# Patient Record
Sex: Female | Born: 1975 | Race: Black or African American | Hispanic: No | Marital: Single | Smoking: Never smoker
Health system: Southern US, Community
[De-identification: ages and names within clinical notes are randomized; demographics above are authoritative.]

## PROBLEM LIST (undated history)

## (undated) ENCOUNTER — Inpatient Hospital Stay (HOSPITAL_COMMUNITY): Admission: AD | Payer: 59 | Admitting: Obstetrics and Gynecology

## (undated) ENCOUNTER — Inpatient Hospital Stay (HOSPITAL_COMMUNITY): Payer: Self-pay

## (undated) DIAGNOSIS — D649 Anemia, unspecified: Secondary | ICD-10-CM

## (undated) DIAGNOSIS — Z789 Other specified health status: Secondary | ICD-10-CM

## (undated) DIAGNOSIS — B999 Unspecified infectious disease: Secondary | ICD-10-CM

## (undated) DIAGNOSIS — IMO0002 Reserved for concepts with insufficient information to code with codable children: Secondary | ICD-10-CM

## (undated) DIAGNOSIS — O1490 Unspecified pre-eclampsia, unspecified trimester: Secondary | ICD-10-CM

## (undated) DIAGNOSIS — R011 Cardiac murmur, unspecified: Secondary | ICD-10-CM

## (undated) DIAGNOSIS — N189 Chronic kidney disease, unspecified: Secondary | ICD-10-CM

## (undated) HISTORY — PX: WISDOM TOOTH EXTRACTION: SHX21

## (undated) HISTORY — DX: Unspecified infectious disease: B99.9

## (undated) HISTORY — PX: THERAPEUTIC ABORTION: SHX798

## (undated) HISTORY — PX: DILATION AND CURETTAGE OF UTERUS: SHX78

---

## 2003-05-01 ENCOUNTER — Inpatient Hospital Stay (HOSPITAL_COMMUNITY): Admission: AD | Admit: 2003-05-01 | Discharge: 2003-05-01 | Payer: Self-pay | Admitting: Obstetrics & Gynecology

## 2003-05-01 ENCOUNTER — Encounter: Payer: Self-pay | Admitting: Obstetrics

## 2003-10-10 ENCOUNTER — Emergency Department (HOSPITAL_COMMUNITY): Admission: EM | Admit: 2003-10-10 | Discharge: 2003-10-10 | Payer: Self-pay | Admitting: Emergency Medicine

## 2004-06-30 ENCOUNTER — Emergency Department (HOSPITAL_COMMUNITY): Admission: EM | Admit: 2004-06-30 | Discharge: 2004-06-30 | Payer: Self-pay | Admitting: Internal Medicine

## 2004-11-19 ENCOUNTER — Emergency Department (HOSPITAL_COMMUNITY): Admission: EM | Admit: 2004-11-19 | Discharge: 2004-11-19 | Payer: Self-pay | Admitting: Family Medicine

## 2005-04-14 ENCOUNTER — Emergency Department (HOSPITAL_COMMUNITY): Admission: EM | Admit: 2005-04-14 | Discharge: 2005-04-14 | Payer: Self-pay | Admitting: Emergency Medicine

## 2009-03-13 ENCOUNTER — Emergency Department (HOSPITAL_COMMUNITY): Admission: EM | Admit: 2009-03-13 | Discharge: 2009-03-14 | Payer: Self-pay | Admitting: Emergency Medicine

## 2009-07-18 ENCOUNTER — Emergency Department (HOSPITAL_COMMUNITY): Admission: EM | Admit: 2009-07-18 | Discharge: 2009-07-18 | Payer: Self-pay | Admitting: Emergency Medicine

## 2009-08-17 ENCOUNTER — Ambulatory Visit: Payer: Self-pay | Admitting: Internal Medicine

## 2009-08-17 DIAGNOSIS — F3289 Other specified depressive episodes: Secondary | ICD-10-CM | POA: Insufficient documentation

## 2009-08-17 DIAGNOSIS — F329 Major depressive disorder, single episode, unspecified: Secondary | ICD-10-CM | POA: Insufficient documentation

## 2009-08-17 LAB — CONVERTED CEMR LAB
ALT: 17 units/L (ref 0–35)
CO2: 30 meq/L (ref 19–32)
Eosinophils Absolute: 0.1 10*3/uL (ref 0.0–0.7)
Eosinophils Relative: 1.6 % (ref 0.0–5.0)
Lymphocytes Relative: 29.6 % (ref 12.0–46.0)
Lymphs Abs: 1.6 10*3/uL (ref 0.7–4.0)
MCHC: 33 g/dL (ref 30.0–36.0)
Neutrophils Relative %: 57.8 % (ref 43.0–77.0)
Platelets: 312 10*3/uL (ref 150.0–400.0)
RBC: 4.3 M/uL (ref 3.87–5.11)
RDW: 12.7 % (ref 11.5–14.6)
Total Protein: 8.1 g/dL (ref 6.0–8.3)
WBC: 5.4 10*3/uL (ref 4.5–10.5)

## 2010-03-24 ENCOUNTER — Emergency Department (HOSPITAL_COMMUNITY): Admission: EM | Admit: 2010-03-24 | Discharge: 2010-03-24 | Payer: Self-pay | Admitting: Emergency Medicine

## 2010-07-28 ENCOUNTER — Emergency Department (HOSPITAL_COMMUNITY): Admission: EM | Admit: 2010-07-28 | Discharge: 2010-07-28 | Payer: Self-pay | Admitting: Emergency Medicine

## 2010-12-01 LAB — URINALYSIS, ROUTINE W REFLEX MICROSCOPIC
Glucose, UA: NEGATIVE mg/dL
Hgb urine dipstick: NEGATIVE
Specific Gravity, Urine: 1.028 (ref 1.005–1.030)
pH: 6.5 (ref 5.0–8.0)

## 2010-12-01 LAB — POCT PREGNANCY, URINE: Preg Test, Ur: NEGATIVE

## 2010-12-01 LAB — URINE MICROSCOPIC-ADD ON

## 2010-12-18 LAB — URINALYSIS, ROUTINE W REFLEX MICROSCOPIC
Leukocytes, UA: NEGATIVE
Nitrite: NEGATIVE
Specific Gravity, Urine: 1.014 (ref 1.005–1.030)
Urobilinogen, UA: 1 mg/dL (ref 0.0–1.0)

## 2010-12-18 LAB — RAPID URINE DRUG SCREEN, HOSP PERFORMED
Opiates: NOT DETECTED
Tetrahydrocannabinol: NOT DETECTED

## 2010-12-18 LAB — CBC
Hemoglobin: 12.3 g/dL (ref 12.0–15.0)
RBC: 4.14 MIL/uL (ref 3.87–5.11)
WBC: 6.3 10*3/uL (ref 4.0–10.5)

## 2010-12-18 LAB — COMPREHENSIVE METABOLIC PANEL
ALT: 18 U/L (ref 0–35)
AST: 22 U/L (ref 0–37)
Alkaline Phosphatase: 59 U/L (ref 39–117)
CO2: 28 mEq/L (ref 19–32)
Chloride: 104 mEq/L (ref 96–112)
GFR calc Af Amer: >60 mL/min (ref >60)
GFR calc non Af Amer: >60 mL/min (ref >60)
Glucose, Bld: 97 mg/dL (ref 70–99)
Potassium: 3.9 mEq/L (ref 3.5–5.1)
Sodium: 135 mEq/L (ref 135–145)

## 2010-12-18 LAB — URINE MICROSCOPIC-ADD ON

## 2010-12-18 LAB — ETHANOL: Alcohol, Ethyl (B): <5 mg/dL (ref 0–10)

## 2010-12-18 LAB — DIFFERENTIAL
Basophils Relative: 2 % — ABNORMAL HIGH (ref 0–1)
Eosinophils Absolute: 0.1 10*3/uL (ref 0.0–0.7)
Eosinophils Relative: 1 % (ref 0–5)
Neutrophils Relative %: 59 % (ref 43–77)

## 2010-12-23 LAB — URINALYSIS, ROUTINE W REFLEX MICROSCOPIC
Glucose, UA: NEGATIVE mg/dL
Ketones, ur: NEGATIVE mg/dL
Protein, ur: NEGATIVE mg/dL
Urobilinogen, UA: 1 mg/dL (ref 0.0–1.0)

## 2010-12-23 LAB — DIFFERENTIAL
Eosinophils Relative: 0 % (ref 0–5)
Lymphocytes Relative: 14 % (ref 12–46)
Lymphs Abs: 1.8 10*3/uL (ref 0.7–4.0)
Monocytes Absolute: 1.6 10*3/uL — ABNORMAL HIGH (ref 0.1–1.0)

## 2010-12-23 LAB — URINE CULTURE: Colony Count: >=100000

## 2010-12-23 LAB — BASIC METABOLIC PANEL
CO2: 27 mEq/L (ref 19–32)
Chloride: 105 mEq/L (ref 96–112)
GFR calc Af Amer: >60 mL/min (ref >60)
GFR calc non Af Amer: >60 mL/min (ref >60)
Glucose, Bld: 112 mg/dL — ABNORMAL HIGH (ref 70–99)

## 2010-12-23 LAB — URINE MICROSCOPIC-ADD ON

## 2010-12-23 LAB — CBC
HCT: 35.5 % — ABNORMAL LOW (ref 36.0–46.0)
Hemoglobin: 11.7 g/dL — ABNORMAL LOW (ref 12.0–15.0)
RBC: 4.05 MIL/uL (ref 3.87–5.11)
WBC: 12.7 10*3/uL — ABNORMAL HIGH (ref 4.0–10.5)

## 2010-12-26 ENCOUNTER — Ambulatory Visit (HOSPITAL_COMMUNITY)
Admission: RE | Admit: 2010-12-26 | Discharge: 2010-12-26 | Disposition: A | Discharge status: Home / Self Care | Payer: PRIVATE HEALTH INSURANCE | Attending: Psychiatry | Admitting: Psychiatry

## 2010-12-26 DIAGNOSIS — F321 Major depressive disorder, single episode, moderate: Secondary | ICD-10-CM | POA: Insufficient documentation

## 2011-01-01 ENCOUNTER — Other Ambulatory Visit (HOSPITAL_COMMUNITY): Payer: PRIVATE HEALTH INSURANCE | Admitting: Psychiatry

## 2011-01-02 ENCOUNTER — Other Ambulatory Visit (HOSPITAL_COMMUNITY): Payer: PRIVATE HEALTH INSURANCE | Attending: Psychiatry | Admitting: Psychiatry

## 2011-01-02 DIAGNOSIS — F321 Major depressive disorder, single episode, moderate: Secondary | ICD-10-CM | POA: Insufficient documentation

## 2011-01-02 DIAGNOSIS — F329 Major depressive disorder, single episode, unspecified: Secondary | ICD-10-CM

## 2011-01-03 ENCOUNTER — Other Ambulatory Visit (HOSPITAL_COMMUNITY): Payer: PRIVATE HEALTH INSURANCE | Admitting: Psychiatry

## 2011-01-06 ENCOUNTER — Other Ambulatory Visit (HOSPITAL_COMMUNITY): Payer: PRIVATE HEALTH INSURANCE | Admitting: Psychiatry

## 2011-01-07 ENCOUNTER — Other Ambulatory Visit (HOSPITAL_COMMUNITY): Payer: PRIVATE HEALTH INSURANCE | Admitting: Psychiatry

## 2011-01-08 ENCOUNTER — Other Ambulatory Visit (HOSPITAL_COMMUNITY): Payer: PRIVATE HEALTH INSURANCE | Admitting: Psychiatry

## 2011-01-09 ENCOUNTER — Other Ambulatory Visit (HOSPITAL_COMMUNITY): Payer: PRIVATE HEALTH INSURANCE | Admitting: Psychiatry

## 2011-01-10 ENCOUNTER — Other Ambulatory Visit (HOSPITAL_COMMUNITY): Payer: PRIVATE HEALTH INSURANCE | Admitting: Psychiatry

## 2011-01-13 ENCOUNTER — Other Ambulatory Visit (HOSPITAL_COMMUNITY): Payer: PRIVATE HEALTH INSURANCE | Admitting: Psychiatry

## 2011-01-14 ENCOUNTER — Other Ambulatory Visit (HOSPITAL_COMMUNITY): Payer: PRIVATE HEALTH INSURANCE | Attending: Psychiatry | Admitting: Psychiatry

## 2011-01-14 DIAGNOSIS — E669 Obesity, unspecified: Secondary | ICD-10-CM | POA: Insufficient documentation

## 2011-01-14 DIAGNOSIS — Z6379 Other stressful life events affecting family and household: Secondary | ICD-10-CM | POA: Insufficient documentation

## 2011-01-14 DIAGNOSIS — F329 Major depressive disorder, single episode, unspecified: Secondary | ICD-10-CM | POA: Insufficient documentation

## 2011-01-14 DIAGNOSIS — F3289 Other specified depressive episodes: Secondary | ICD-10-CM | POA: Insufficient documentation

## 2011-01-15 ENCOUNTER — Other Ambulatory Visit (HOSPITAL_COMMUNITY): Payer: PRIVATE HEALTH INSURANCE | Admitting: Psychiatry

## 2011-01-16 ENCOUNTER — Other Ambulatory Visit (HOSPITAL_COMMUNITY): Payer: PRIVATE HEALTH INSURANCE | Admitting: Psychiatry

## 2011-01-17 ENCOUNTER — Other Ambulatory Visit (HOSPITAL_COMMUNITY): Payer: PRIVATE HEALTH INSURANCE | Admitting: Psychiatry

## 2011-01-17 ENCOUNTER — Ambulatory Visit (HOSPITAL_BASED_OUTPATIENT_CLINIC_OR_DEPARTMENT_OTHER): Payer: 59 | Admitting: Psychology

## 2011-01-17 DIAGNOSIS — F331 Major depressive disorder, recurrent, moderate: Secondary | ICD-10-CM

## 2011-01-20 ENCOUNTER — Other Ambulatory Visit (HOSPITAL_COMMUNITY): Payer: PRIVATE HEALTH INSURANCE | Admitting: Psychiatry

## 2011-01-21 ENCOUNTER — Other Ambulatory Visit (HOSPITAL_COMMUNITY): Payer: PRIVATE HEALTH INSURANCE | Admitting: Psychiatry

## 2011-01-24 ENCOUNTER — Encounter (HOSPITAL_COMMUNITY): Payer: 59 | Admitting: Psychology

## 2011-02-12 ENCOUNTER — Ambulatory Visit (HOSPITAL_COMMUNITY): Payer: 59 | Admitting: Psychiatry

## 2011-07-29 ENCOUNTER — Encounter: Payer: Self-pay | Admitting: Emergency Medicine

## 2011-07-29 ENCOUNTER — Emergency Department (HOSPITAL_COMMUNITY)
Admission: EM | Admit: 2011-07-29 | Discharge: 2011-07-30 | Discharge status: Against Medical Advice | Payer: 59 | Attending: Emergency Medicine | Admitting: Emergency Medicine

## 2011-07-29 DIAGNOSIS — R109 Unspecified abdominal pain: Secondary | ICD-10-CM | POA: Insufficient documentation

## 2011-07-29 NOTE — ED Notes (Signed)
Pt states she feels like her body is telling her something is not right  Pt states she has a headache, is very tired, is having pain and cramping all over in her stomach  Pt states she gets abd pain off and on and last time she saw a dr she was told the pain may be caused by ovulation but this pain is different  Pt states she had to leave work early today because of the pain   Pt state she has spells of nausea without vomiting  Pt states she does not have diarrhea but is having more frequent bowel movements than she normally does

## 2011-08-01 ENCOUNTER — Emergency Department (INDEPENDENT_AMBULATORY_CARE_PROVIDER_SITE_OTHER)
Admission: EM | Admit: 2011-08-01 | Discharge: 2011-08-01 | Disposition: A | Discharge status: Nursing Home (Includes ICF) | Payer: 59 | Source: Home / Self Care | Attending: Emergency Medicine | Admitting: Emergency Medicine

## 2011-08-01 ENCOUNTER — Emergency Department (HOSPITAL_COMMUNITY)
Admission: EM | Admit: 2011-08-01 | Discharge: 2011-08-02 | Disposition: A | Discharge status: Home / Self Care | Payer: 59 | Attending: Emergency Medicine | Admitting: Emergency Medicine

## 2011-08-01 ENCOUNTER — Encounter (HOSPITAL_COMMUNITY): Payer: Self-pay

## 2011-08-01 ENCOUNTER — Encounter (HOSPITAL_COMMUNITY): Payer: Self-pay | Admitting: Emergency Medicine

## 2011-08-01 DIAGNOSIS — B9689 Other specified bacterial agents as the cause of diseases classified elsewhere: Secondary | ICD-10-CM

## 2011-08-01 DIAGNOSIS — N760 Acute vaginitis: Secondary | ICD-10-CM | POA: Insufficient documentation

## 2011-08-01 DIAGNOSIS — N83209 Unspecified ovarian cyst, unspecified side: Secondary | ICD-10-CM | POA: Insufficient documentation

## 2011-08-01 DIAGNOSIS — R1031 Right lower quadrant pain: Secondary | ICD-10-CM | POA: Insufficient documentation

## 2011-08-01 DIAGNOSIS — A499 Bacterial infection, unspecified: Secondary | ICD-10-CM | POA: Insufficient documentation

## 2011-08-01 DIAGNOSIS — N83201 Unspecified ovarian cyst, right side: Secondary | ICD-10-CM

## 2011-08-01 DIAGNOSIS — R1032 Left lower quadrant pain: Secondary | ICD-10-CM | POA: Insufficient documentation

## 2011-08-01 DIAGNOSIS — R51 Headache: Secondary | ICD-10-CM

## 2011-08-01 DIAGNOSIS — R109 Unspecified abdominal pain: Secondary | ICD-10-CM

## 2011-08-01 NOTE — ED Notes (Signed)
Pt here today due to pain she states has been going on for the past 3 weeks, and getting worse. Last saw her OB-GYN  (Dr Thomasene Lot ) in September for regular check up. Pain today LLQ, RLQ, LUQ ; denies vomiting, or diarrhea, but has been having nausea, HA, dizziness; has not taken anything for this problem

## 2011-08-01 NOTE — ED Provider Notes (Signed)
History     CSN: [id number] Arrival date & time: 08/01/2011  9:28 PM   First MD Initiated Contact with Patient 08/01/11 2023      Chief Complaint  Patient presents with  . Abdominal Pain   HPI Comments: Pt with 3 weeks of intermittent RLQ and LLQ pain, not associated with eating, fasting, BM, walking. Pain worse with lying on her left side. Has nausea, decreased appetite. no vomiting, fevers, abd distension, diarrhea, constipation. Last BM earlier today was WNL. No vaginal bleeding, vaginal d/c. Presented to ED for same pain earlier this week but left prior to triage. Has not tried anything for this. No h/o abd surgeries. Also c/o generalized weakness, constant HA and lighthededness today.   Patient is a 35 y.o. female presenting with abdominal pain. The history is provided by the patient.  Abdominal Pain The primary symptoms of the illness include abdominal pain, fever, fatigue and nausea. The primary symptoms of the illness do not include shortness of breath, vomiting, vaginal discharge or vaginal bleeding. The current episode started more than 2 days ago. The problem has been gradually worsening.  The patient states that she believes she is currently not pregnant. The patient has not had a change in bowel habit. Additional symptoms associated with the illness include anorexia. Symptoms associated with the illness do not include chills, diaphoresis, constipation, urgency, hematuria, frequency or back pain.    History reviewed. No pertinent past medical history.  History reviewed. No pertinent past surgical history.  Family History  Problem Relation Age of Onset  . Diabetes Other     History  Substance Use Topics  . Smoking status: Never Smoker   . Smokeless tobacco: Not on file  . Alcohol Use: No    OB History    Grav Para Term Preterm Abortions TAB SAB Ect Mult Living                  Review of Systems  Constitutional: Positive for fever and fatigue. Negative for chills  and diaphoresis.  Respiratory: Negative for chest tightness, shortness of breath and wheezing.   Cardiovascular: Negative for chest pain.  Gastrointestinal: Positive for nausea, abdominal pain and anorexia. Negative for vomiting, constipation and blood in stool.  Genitourinary: Positive for pelvic pain. Negative for urgency, frequency, hematuria, vaginal bleeding, vaginal discharge and vaginal pain.  Musculoskeletal: Negative for back pain.  Skin: Negative for rash.  Neurological: Positive for light-headedness and headaches.    Allergies  Review of patient's allergies indicates no known allergies.  Home Medications  No current outpatient prescriptions on file.  BP 127/82  Pulse 67  Temp(Src) 98.4 F (36.9 C) (Oral)  Resp 20  SpO2 100%  LMP 07/22/2011  Physical Exam  Nursing note and vitals reviewed. Constitutional: She is oriented to person, place, and time. She appears well-developed and well-nourished. No distress.  HENT:  Head: Normocephalic and atraumatic.  Eyes: EOM are normal. Pupils are equal, round, and reactive to light.  Neck: Normal range of motion.  Cardiovascular: Regular rhythm.   Pulmonary/Chest: Effort normal and breath sounds normal.  Abdominal: Soft. Normal appearance and bowel sounds are normal. She exhibits no distension and no mass. There is tenderness in the right lower quadrant and left lower quadrant. There is no rebound, no guarding, no CVA tenderness, no tenderness at McBurney's point and negative Murphy's sign.  Musculoskeletal: Normal range of motion.  Neurological: She is alert and oriented to person, place, and time. She has normal strength. No cranial  nerve deficit. Gait normal.  Skin: Skin is warm and dry.  Psychiatric: She has a normal mood and affect. Her behavior is normal. Judgment and thought content normal.    ED Course  Procedures (including critical care time)  Labs Reviewed - No data to display No results found.   1. Abdominal  pain   2. Headache      MDM    Luiz Blare, MD 08/01/11 [phone]

## 2011-08-01 NOTE — ED Notes (Signed)
Pt from [facility].  C/o intermittent lower abd pain x 3 weeks that is gradually getting worse.  C/o nausea.  Denies vomiting and urinary complaints.  Normal BM today.  Also reports headache and feeling lightheaded.

## 2011-08-01 NOTE — ED Notes (Signed)
Pt states she went to the ED earlier this week, but LWBS

## 2011-08-02 ENCOUNTER — Emergency Department (HOSPITAL_COMMUNITY): Payer: 59

## 2011-08-02 ENCOUNTER — Encounter (HOSPITAL_COMMUNITY): Payer: Self-pay

## 2011-08-02 LAB — PREGNANCY, URINE: Preg Test, Ur: NEGATIVE

## 2011-08-02 LAB — WET PREP, GENITAL
Trich, Wet Prep: NONE SEEN
WBC, Wet Prep HPF POC: NONE SEEN
Yeast Wet Prep HPF POC: NONE SEEN

## 2011-08-02 LAB — URINALYSIS, ROUTINE W REFLEX MICROSCOPIC
Bilirubin Urine: NEGATIVE
Glucose, UA: NEGATIVE mg/dL
Hgb urine dipstick: NEGATIVE
Ketones, ur: NEGATIVE mg/dL
Leukocytes, UA: NEGATIVE
Nitrite: NEGATIVE
Protein, ur: NEGATIVE mg/dL
Specific Gravity, Urine: 1.023 (ref 1.005–1.030)
Urobilinogen, UA: 1 mg/dL (ref 0.0–1.0)
pH: 6 (ref 5.0–8.0)

## 2011-08-02 MED ORDER — METRONIDAZOLE 500 MG PO TABS: 500.0000 mg | ORAL_TABLET | Freq: Two times a day (BID) | ORAL | Status: AC

## 2011-08-02 MED ORDER — ACETAMINOPHEN-CODEINE #3 300-30 MG PO TABS: 1.0000 | ORAL_TABLET | Freq: Four times a day (QID) | ORAL | Status: AC | PRN

## 2011-08-02 NOTE — Discharge Instructions (Signed)
Bacterial Vaginosis Bacterial vaginosis (BV) is a vaginal infection where the normal balance of bacteria in the vagina is disrupted. The normal balance is then replaced by an overgrowth of certain bacteria. There are several different kinds of bacteria that can cause BV. BV is the most common vaginal infection in women of childbearing age. CAUSES   The cause of BV is not fully understood. BV develops when there is an increase or imbalance of harmful bacteria.   Some activities or behaviors can upset the normal balance of bacteria in the vagina and put women at increased risk including:   Having a new sex partner or multiple sex partners.   Douching.   Using an intrauterine device (IUD) for contraception.   It is not clear what role sexual activity plays in the development of BV. However, women that have never had sexual intercourse are rarely infected with BV.  Women do not get BV from toilet seats, bedding, swimming pools or from touching objects around them.  SYMPTOMS   Grey vaginal discharge.   A fish-like odor with discharge, especially after sexual intercourse.   Itching or burning of the vagina and vulva.   Burning or pain with urination.   Some women have no signs or symptoms at all.  DIAGNOSIS  Your caregiver must examine the vagina for signs of BV. Your caregiver will perform lab tests and look at the sample of vaginal fluid through a microscope. They will look for bacteria and abnormal cells (clue cells), a pH test higher than 4.5, and a positive amine test all associated with BV.  RISKS AND COMPLICATIONS   Pelvic inflammatory disease (PID).   Infections following gynecology surgery.   Developing HIV.   Developing herpes virus.  TREATMENT  Sometimes BV will clear up without treatment. However, all women with symptoms of BV should be treated to avoid complications, especially if gynecology surgery is planned. Female partners generally do not need to be treated. However,  BV may spread between female sex partners so treatment is helpful in preventing a recurrence of BV.   BV may be treated with antibiotics. The antibiotics come in either pill or vaginal cream forms. Either can be used with nonpregnant or pregnant women, but the recommended dosages differ. These antibiotics are not harmful to the baby.   BV can recur after treatment. If this happens, a second round of antibiotics will often be prescribed.   Treatment is important for pregnant women. If not treated, BV can cause a premature delivery, especially for a pregnant woman who had a premature birth in the past. All pregnant women who have symptoms of BV should be checked and treated.   For chronic reoccurrence of BV, treatment with a type of prescribed gel vaginally twice a week is helpful.  HOME CARE INSTRUCTIONS   Finish all medication as directed by your caregiver.   Do not have sex until treatment is completed.   Tell your sexual partner that you have a vaginal infection. They should see their caregiver and be treated if they have problems, such as a mild rash or itching.   Practice safe sex. Use condoms. Only have 1 sex partner.  PREVENTION  Basic prevention steps can help reduce the risk of upsetting the natural balance of bacteria in the vagina and developing BV:  Do not have sexual intercourse (be abstinent).   Do not douche.   Use all of the medicine prescribed for treatment of BV, even if the signs and symptoms go away.     Tell your sex partner if you have BV. That way, they can be treated, if needed, to prevent reoccurrence.  SEEK MEDICAL CARE IF:   Your symptoms are not improving after 3 days of treatment.   You have increased discharge, pain, or fever.  MAKE SURE YOU:   Understand these instructions.   Will watch your condition.   Will get help right away if you are not doing well or get worse.  FOR MORE INFORMATION  Division of STD Prevention (DSTDP), Centers for Disease  Control and Prevention: www.cdc.gov/std American [employer] Association (ASHA): www.ashastd.org  Document Released: 09/01/2005 Document Revised: 05/14/2011 Document Reviewed: 02/22/2009 ExitCare Patient Information 2012 [employer].Ovarian Cyst The ovaries are small organs that are on each side of the uterus. The ovaries are the organs that produce the female hormones, estrogen and progesterone. An ovarian cyst is a sac filled with fluid that can vary in its size. It is normal for a small cyst to form in women who are in the childbearing age and who have menstrual periods. This type of cyst is called a follicle cyst that becomes an ovulation cyst (corpus luteum cyst) after it produces the women's egg. It later goes away on its own if the woman does not become pregnant. There are other kinds of ovarian cysts that may cause problems and may need to be treated. The most serious problem is a cyst with cancer. It should be noted that menopausal women who have an ovarian cyst are at a higher risk of it being a cancer cyst. They should be evaluated very quickly, thoroughly and followed closely. This is especially true in menopausal women because of the high rate of ovarian cancer in women in menopause. CAUSES AND TYPES OF OVARIAN CYSTS:  FUNCTIONAL CYST: The follicle/corpus luteum cyst is a functional cyst that occurs every month during ovulation with the menstrual cycle. They go away with the next menstrual cycle if the woman does not get pregnant. Usually, there are no symptoms with a functional cyst.   ENDOMETRIOMA CYST: This cyst develops from the lining of the uterus tissue. This cyst gets in or on the ovary. It grows every month from the bleeding during the menstrual period. It is also called a "chocolate cyst" because it becomes filled with blood that turns Tuccillo. This cyst can cause pain in the lower abdomen during intercourse and with your menstrual period.   CYSTADENOMA CYST: This cyst develops  from the cells on the outside of the ovary. They usually are not cancerous. They can get very big and cause lower abdomen pain and pain with intercourse. This type of cyst can twist on itself, cut off its blood supply and cause severe pain. It also can easily rupture and cause a lot of pain.   DERMOID CYST: This type of cyst is sometimes found in both ovaries. They are found to have different kinds of body tissue in the cyst. The tissue includes skin, teeth, hair, and/or cartilage. They usually do not have symptoms unless they get very big. Dermoid cysts are rarely cancerous.   POLYCYSTIC OVARY: This is a rare condition with hormone problems that produces many small cysts on both ovaries. The cysts are follicle-like cysts that never produce an egg and become a corpus luteum. It can cause an increase in body weight, infertility, acne, increase in body and facial hair and lack of menstrual periods or rare menstrual periods. Many women with this problem develop type 2 diabetes. The exact cause of this   problem is unknown. A polycystic ovary is rarely cancerous.   THECA LUTEIN CYST: Occurs when too much hormone (human chorionic gonadotropin) is produced and over-stimulates the ovaries to produce an egg. They are frequently seen when doctors stimulate the ovaries for invitro-fertilization (test tube babies).   LUTEOMA CYST: This cyst is seen during pregnancy. Rarely it can cause an obstruction to the birth canal during labor and delivery. They usually go away after delivery.  SYMPTOMS   Pelvic pain or pressure.   Pain during sexual intercourse.   Increasing girth (swelling) of the abdomen.   Abnormal menstrual periods.   Increasing pain with menstrual periods.   You stop having menstrual periods and you are not pregnant.  DIAGNOSIS  The diagnosis can be made during:  Routine or annual pelvic examination (common).   Ultrasound.   X-Ahniya Mitchum of the pelvis.   CT Scan.   MRI.   Blood tests.    TREATMENT   Treatment may only be to follow the cyst monthly for 2 to 3 months with your caregiver. Many go away on their own, especially functional cysts.   May be aspirated (drained) with a long needle with ultrasound, or by laparoscopy (inserting a tube into the pelvis through a small incision).   The whole cyst can be removed by laparoscopy.   Sometimes the cyst may need to be removed through an incision in the lower abdomen.   Hormone treatment is sometimes used to help dissolve certain cysts.   Birth control pills are sometimes used to help dissolve certain cysts.  HOME CARE INSTRUCTIONS  Follow your caregiver's advice regarding:  Medicine.   Follow up visits to evaluate and treat the cyst.   You may need to come back or make an appointment with another caregiver, to find the exact cause of your cyst, if your caregiver is not a gynecologist.   Get your yearly and recommended pelvic examinations and Pap tests.   Let your caregiver know if you have had an ovarian cyst in the past.  SEEK MEDICAL CARE IF:   Your periods are late, irregular, they stop, or are painful.   Your stomach (abdomen) or pelvic pain does not go away.   Your stomach becomes larger or swollen.   You have pressure on your bladder or trouble emptying your bladder completely.   You have painful sexual intercourse.   You have feelings of fullness, pressure, or discomfort in your stomach.   You lose weight for no apparent reason.   You feel generally ill.   You become constipated.   You lose your appetite.   You develop acne.   You have an increase in body and facial hair.   You are gaining weight, without changing your exercise and eating habits.   You think you are pregnant.  SEEK IMMEDIATE MEDICAL CARE IF:   You have increasing abdominal pain.   You feel sick to your stomach (nausea) and/or vomit.   You develop a fever that comes on suddenly.   You develop abdominal pain during a  bowel movement.   Your menstrual periods become heavier than usual.  Document Released: 09/01/2005 Document Revised: 05/14/2011 Document Reviewed: 07/05/2009 ExitCare Patient Information 2012 [employer]. 

## 2011-08-02 NOTE — ED Provider Notes (Signed)
History     CSN: [id number] Arrival date & time: 08/01/2011 11:45 PM   First MD Initiated Contact with Patient 08/02/11 0004      Chief Complaint  Patient presents with  . 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 She reports that this was told to her by her OB/GYN. However normally she only has pain for about one day and over the last 3 days she's had more constant pain on the right lower abdomen close to her flank area. She's also had some moving pains in the left lower quadrant as well. She denies any fever or chills. She is mainly concerned because his symptoms have been getting worse and have persisted. She has taken some over-the-counter medications without any long-lasting relief. She has not spoken to her OB/GYN. She did have an appointment this past Friday but hadn't missed to do to a work meeting. She also tried to get a was a long on Monday but due to the long wait went home without being seen. She was seen at the urgent care this evening but after initial evaluation was sent here to the emergency department for further evaluation. No testing was done at that time. The patient reports she has mild to moderate pain currently but when asked states she does not want any pain medications at this time. She has been sexually active with a relatively new partner over the last 3 or 4 months. She has been pregnant in the past and reports that she and her partner have been trying to get pregnant. Her last period was about 10 days ago. She has noticed a slight white discharge without any foul odor. She denies fever, chills, rash, diarrhea, nausea or vomiting.  Patient is a 35 y.o. female presenting with abdominal pain. The history is provided by the patient.  Abdominal Pain The primary symptoms  of the illness include abdominal pain. The primary symptoms of the illness do not include nausea, vomiting or diarrhea.  Symptoms associated with the illness do not include urgency.    History reviewed. No pertinent past medical history.  History reviewed. No pertinent past surgical history.  Family History  Problem Relation Age of Onset  . Diabetes Other     History  Substance Use Topics  . Smoking status: Never Smoker   . Smokeless tobacco: Not on file  . Alcohol Use: No    OB History    Grav Para Term Preterm Abortions TAB SAB Ect Mult Living                  Review of Systems  Constitutional: Negative.   Gastrointestinal: Positive for abdominal pain. Negative for nausea, vomiting and diarrhea.  Genitourinary: Negative for urgency, flank pain and pelvic pain.  All other systems reviewed and are negative.    Allergies  Review of patient's allergies indicates no known allergies.  Home Medications  No current outpatient prescriptions on file.  BP 124/63  Pulse 70  Temp(Src) 98.2 F (36.8 C) (Oral)  SpO2 99%  LMP 07/22/2011  Physical Exam  Constitutional: She appears well-developed and well-nourished.  Eyes: Pupils are equal, round, and reactive to light. No scleral icterus.  Neck: Neck supple.  Abdominal: Soft. Bowel sounds are normal. She exhibits no distension.    Genitourinary:       Patient with no cervical motion tenderness. There is a  white creamy discharge that is mild. She has minimal to mild tenderness in the left adnexal region greater than right. No bleeding is noted the  Skin: No rash noted.    ED Course  Procedures (including critical care time)   Labs Reviewed  URINALYSIS, ROUTINE W REFLEX MICROSCOPIC  PREGNANCY, URINE  GC/CHLAMYDIA PROBE AMP, GENITAL  WET PREP, GENITAL   No results found.   No diagnosis found.   RA sats is 99% and normal MDM   Patient's exam is not too impressive to me. She does not have any pain or  tenderness at McBurney's point and with intermittent symptoms ongoing for several weeks am not suspicious for appendicitis. She is no abdominal tenderness above her umbilicus she does not have a surgical abdomen. Wet prep and GC Chlamydia tests were sent. Urine and urine pregnancy test were sent as well. She denies wanting any pain medications at this time. Ultrasound of her pelvis is currently pending. She also has a OB/GYN that she can followup with as an outpatient which is encouraged to her.      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  Otherwise, no abscess, torsion.  Wet prep shows BV.  Pt informed tests for STD's pending. Pt given analgesic prescription, flagyl and instructed to follow up with her OB/GYN for follow up.    Gavin Pound. Oletta Lamas, MD 08/02/11 2213

## 2011-08-02 NOTE — ED Notes (Signed)
Pt to US.

## 2011-08-04 LAB — GC/CHLAMYDIA PROBE AMP, GENITAL
Chlamydia, DNA Probe: NEGATIVE
GC Probe Amp, Genital: NEGATIVE

## 2011-09-23 ENCOUNTER — Emergency Department (INDEPENDENT_AMBULATORY_CARE_PROVIDER_SITE_OTHER)
Admission: EM | Admit: 2011-09-23 | Discharge: 2011-09-23 | Disposition: A | Discharge status: Home / Self Care | Payer: 59 | Source: Home / Self Care | Attending: Family Medicine | Admitting: Family Medicine

## 2011-09-23 ENCOUNTER — Encounter (HOSPITAL_COMMUNITY): Payer: Self-pay | Admitting: *Deleted

## 2011-09-23 DIAGNOSIS — N390 Urinary tract infection, site not specified: Secondary | ICD-10-CM

## 2011-09-23 LAB — POCT URINALYSIS DIP (DEVICE)
Ketones, ur: 40 mg/dL — AB
Protein, ur: >=300 mg/dL — AB
Specific Gravity, Urine: >=1.03 (ref 1.005–1.030)
pH: 6 (ref 5.0–8.0)

## 2011-09-23 MED ORDER — CEPHALEXIN 500 MG PO CAPS: 500.0000 mg | ORAL_CAPSULE | Freq: Two times a day (BID) | ORAL | Status: AC

## 2011-09-23 NOTE — ED Provider Notes (Signed)
History     CSN: [id number]  Arrival date & time 09/23/11  1721   First MD Initiated Contact with Patient 09/23/11 1734      Chief Complaint  Patient presents with  . Abdominal Pain    (Consider location/radiation/quality/duration/timing/severity/associated sxs/prior treatment) HPI Comments: Janice Anderson presents for evaluation of hematuria, dysuria, urinary frequency, and urgency over the last week. She reports passing several small clots. She reports sexual activity with one partner, without protection. She denies any fever, no nausea, no vomiting. She also reports suprapubic pressure.   Patient is a 36 y.o. female presenting with frequency. The history is provided by the patient.  Urinary Frequency This is a new problem. The current episode started more than 2 days ago. The problem occurs constantly. The problem has not changed since onset.The symptoms are aggravated by nothing. The symptoms are relieved by nothing.    History reviewed. No pertinent past medical history.  History reviewed. No pertinent past surgical history.  Family History  Problem Relation Age of Onset  . Diabetes Other     History  Substance Use Topics  . Smoking status: Never Smoker   . Smokeless tobacco: Not on file  . Alcohol Use: No    OB History    Grav Para Term Preterm Abortions TAB SAB Ect Mult Living                  Review of Systems  Constitutional: Negative.   HENT: Negative.   Eyes: Negative.   Respiratory: Negative.   Cardiovascular: Negative.   Gastrointestinal: Negative.   Genitourinary: Positive for dysuria, urgency, frequency and decreased urine volume. Negative for flank pain, vaginal bleeding and vaginal discharge.  Musculoskeletal: Negative.   Skin: Negative.   Neurological: Negative.     Allergies  Review of patient's allergies indicates no known allergies.  Home Medications   Current Outpatient Rx  Name Route Sig Dispense Refill  . CEPHALEXIN 500 MG PO CAPS Oral  Take 1 capsule (500 mg total) by mouth 2 (two) times daily. 14 capsule 0    BP 118/60  Pulse 79  Temp(Src) 98 F (36.7 C) (Oral)  Resp 16  SpO2 100%  Physical Exam  Nursing note and vitals reviewed. Constitutional: She is oriented to person, place, and time. She appears well-developed and well-nourished.  HENT:  Head: Normocephalic and atraumatic.  Eyes: EOM are normal.  Neck: Normal range of motion.  Pulmonary/Chest: Effort normal.  Abdominal: Soft. Normal appearance and bowel sounds are normal. There is tenderness in the suprapubic area.  Musculoskeletal: Normal range of motion.  Neurological: She is alert and oriented to person, place, and time.  Skin: Skin is warm and dry.  Psychiatric: Her behavior is normal.    ED Course  Procedures (including critical care time)  Labs Reviewed  POCT URINALYSIS DIP (DEVICE) - Abnormal; Notable for the following:    Bilirubin Urine SMALL (*)    Ketones, ur 40 (*)    Hgb urine dipstick LARGE (*)    Protein, ur >=300 (*)    Urobilinogen, UA 2.0 (*)    Leukocytes, UA SMALL (*) Biochemical Testing Only. Please order routine urinalysis from main lab if confirmatory testing is needed.   All other components within normal limits  POCT PREGNANCY, URINE  POCT URINALYSIS DIPSTICK  POCT PREGNANCY, URINE   No results found.   1. UTI (lower urinary tract infection)       MDM  Labs reviewed; rx for cephalexin given  Richardo Priest, MD 09/23/11 2214

## 2011-09-23 NOTE — ED Notes (Signed)
Pt  Reports low  abd  Pain  With  Frequent       painfull  Urination        Symptoms  Began  3  Days  Ago       Pt  denys  Any  Other  Symptoms   Pt  Is  Awake   As  Well  As  lert  And  Oriented

## 2011-11-14 DIAGNOSIS — B999 Unspecified infectious disease: Secondary | ICD-10-CM

## 2011-11-14 HISTORY — DX: Unspecified infectious disease: B99.9

## 2012-05-25 ENCOUNTER — Inpatient Hospital Stay (HOSPITAL_COMMUNITY): Payer: 59

## 2012-05-25 ENCOUNTER — Encounter (HOSPITAL_COMMUNITY): Payer: Self-pay

## 2012-05-25 ENCOUNTER — Inpatient Hospital Stay (HOSPITAL_COMMUNITY)
Admission: AD | Admit: 2012-05-25 | Discharge: 2012-05-25 | Disposition: A | Discharge status: Home / Self Care | Payer: 59 | Source: Ambulatory Visit | Attending: Obstetrics and Gynecology | Admitting: Obstetrics and Gynecology

## 2012-05-25 DIAGNOSIS — O99891 Other specified diseases and conditions complicating pregnancy: Secondary | ICD-10-CM | POA: Insufficient documentation

## 2012-05-25 DIAGNOSIS — Z1389 Encounter for screening for other disorder: Secondary | ICD-10-CM

## 2012-05-25 DIAGNOSIS — Z3490 Encounter for supervision of normal pregnancy, unspecified, unspecified trimester: Secondary | ICD-10-CM

## 2012-05-25 DIAGNOSIS — O26899 Other specified pregnancy related conditions, unspecified trimester: Secondary | ICD-10-CM

## 2012-05-25 DIAGNOSIS — R109 Unspecified abdominal pain: Secondary | ICD-10-CM | POA: Insufficient documentation

## 2012-05-25 HISTORY — DX: Other specified health status: Z78.9

## 2012-05-25 LAB — URINALYSIS, ROUTINE W REFLEX MICROSCOPIC
Bilirubin Urine: NEGATIVE
Glucose, UA: NEGATIVE mg/dL
Specific Gravity, Urine: 1.03 (ref 1.005–1.030)
pH: 6 (ref 5.0–8.0)

## 2012-05-25 LAB — WET PREP, GENITAL
Trich, Wet Prep: NONE SEEN
Yeast Wet Prep HPF POC: NONE SEEN

## 2012-05-25 LAB — CBC WITH DIFFERENTIAL/PLATELET
Basophils Relative: 0 % (ref 0–1)
Eosinophils Absolute: 0.1 10*3/uL (ref 0.0–0.7)
Hemoglobin: 11.8 g/dL — ABNORMAL LOW (ref 12.0–15.0)
MCH: 28.4 pg (ref 26.0–34.0)
MCHC: 33.1 g/dL (ref 30.0–36.0)
Monocytes Relative: 9 % (ref 3–12)
Neutrophils Relative %: 66 % (ref 43–77)

## 2012-05-25 LAB — ABO/RH: ABO/RH(D): AB POS

## 2012-05-25 LAB — URINE MICROSCOPIC-ADD ON

## 2012-05-25 NOTE — MAU Note (Signed)
Pt reports positive home preg test, pain on rt side today. Shortness of breath with pain. Pain just last few hours. LMP  02/28/2012

## 2012-05-25 NOTE — MAU Note (Signed)
Pt came out of room and states she needed to go outside and tell her ride she was almost done. 20 minutes later patient still has not come back, patient not in waiting room.

## 2012-05-25 NOTE — MAU Provider Note (Signed)
History     CSN: [id number]  Arrival date and time: 05/25/12 1853   None     Chief Complaint  Patient presents with  . Possible Pregnancy   HPI Janice Anderson is a 36 y.o. female @ [redacted]w[redacted]d gestation who presents to MAU with abdominal pain. The pain started today and she describes the pain as cramping pressure on the right side. She rates the pain as 8/10. First OB appointment scheduled with Dr. Claiborne Billings for 06/07/12. The history was provided by the patient.  OB History    Grav Para Term Preterm Abortions TAB SAB Ect Mult Living   7 3 2 1 3 1 2   2       Past Medical History  Diagnosis Date  . No pertinent past medical history     Past Surgical History  Procedure Date  . No past surgeries     Family History  Problem Relation Age of Onset  . Diabetes Other   . Other Neg Hx     History  Substance Use Topics  . Smoking status: Never Smoker   . Smokeless tobacco: Not on file  . Alcohol Use: No    Allergies: No Known Allergies  No prescriptions prior to admission    Review of Systems  Constitutional: Negative for fever, chills and weight loss.  HENT: Negative for ear pain, nosebleeds, congestion, sore throat and neck pain.   Eyes: Negative for blurred vision, double vision, photophobia and pain.  Respiratory: Negative for cough and wheezing.   Cardiovascular: Negative for chest pain, palpitations and leg swelling.  Gastrointestinal: Positive for nausea and abdominal pain. Negative for heartburn, vomiting, diarrhea and constipation.  Genitourinary: Positive for frequency. Negative for dysuria and urgency.  Musculoskeletal: Positive for back pain. Negative for myalgias.  Skin: Negative for itching and rash.  Neurological: Positive for dizziness and headaches. Negative for sensory change, speech change, seizures and weakness.  Endo/Heme/Allergies: Does not bruise/bleed easily.  Psychiatric/Behavioral: Negative for depression. The patient is not nervous/anxious and  does not have insomnia.    Physical Exam   Blood pressure 135/45, pulse 71, temperature 98.6 F (37 C), temperature source Oral, resp. rate 18, height 5\' 5"  (1.651 m), weight 213 lb (96.616 kg), last menstrual period 02/28/2012, SpO2 100.00%.  Physical Exam  Nursing note and vitals reviewed. Constitutional: She is oriented to person, place, and time. She appears well-developed and well-nourished. No distress.  HENT:  Head: Normocephalic and atraumatic.  Eyes: EOM are normal.  Neck: Neck supple.  Cardiovascular: Normal rate.   Respiratory: Effort normal.  GI: Soft. There is tenderness in the right lower quadrant. There is no rigidity, no rebound, no guarding and no CVA tenderness.  Genitourinary:       External genitalia without lesions. White discharge vaginal vault. No CMT, mild right adnexal tenderness. Uterus approximately 10 week size.   Musculoskeletal: Normal range of motion.  Neurological: She is alert and oriented to person, place, and time.  Skin: Skin is warm and dry.  Psychiatric: She has a normal mood and affect. Her behavior is normal. Judgment and thought content normal.   Results for orders placed during the hospital encounter of 05/25/12 (from the past 24 hour(s))  URINALYSIS, ROUTINE W REFLEX MICROSCOPIC     Status: Abnormal   Collection Time   05/25/12  8:30 PM      Component Value Range   Color, Urine YELLOW  YELLOW   APPearance CLEAR  CLEAR   Specific Gravity, Urine 1.030  1.005 - 1.030   pH 6.0  5.0 - 8.0   Glucose, UA NEGATIVE  NEGATIVE mg/dL   Hgb urine dipstick TRACE (*) NEGATIVE   Bilirubin Urine NEGATIVE  NEGATIVE   Ketones, ur NEGATIVE  NEGATIVE mg/dL   Protein, ur NEGATIVE  NEGATIVE mg/dL   Urobilinogen, UA 0.2  0.0 - 1.0 mg/dL   Nitrite NEGATIVE  NEGATIVE   Leukocytes, UA SMALL (*) NEGATIVE  URINE MICROSCOPIC-ADD ON     Status: Abnormal   Collection Time   05/25/12  8:30 PM      Component Value Range   Squamous Epithelial / LPF FEW (*) RARE    WBC, UA 3-6  <3 WBC/hpf   RBC / HPF 3-6  <3 RBC/hpf   Bacteria, UA FEW (*) RARE   Urine-Other RARE YEAST    POCT PREGNANCY, URINE     Status: Abnormal   Collection Time   05/25/12  8:54 PM      Component Value Range   Preg Test, Ur POSITIVE (*) NEGATIVE  CBC WITH DIFFERENTIAL     Status: Abnormal   Collection Time   05/25/12  9:55 PM      Component Value Range   WBC 9.4  4.0 - 10.5 K/uL   RBC 4.16  3.87 - 5.11 MIL/uL   Hemoglobin 11.8 (*) 12.0 - 15.0 g/dL   HCT 16.1 (*) 09.6 - 04.5 %   MCV 85.6  78.0 - 100.0 fL   MCH 28.4  26.0 - 34.0 pg   MCHC 33.1  30.0 - 36.0 g/dL   RDW 40.9  81.1 - 91.4 %   Platelets 309  150 - 400 K/uL   Neutrophils Relative 66  43 - 77 %   Neutro Abs 6.2  1.7 - 7.7 K/uL   Lymphocytes Relative 25  12 - 46 %   Lymphs Abs 2.3  0.7 - 4.0 K/uL   Monocytes Relative 9  3 - 12 %   Monocytes Absolute 0.8  0.1 - 1.0 K/uL   Eosinophils Relative 1  0 - 5 %   Eosinophils Absolute 0.1  0.0 - 0.7 K/uL   Basophils Relative 0  0 - 1 %   Basophils Absolute 0.0  0.0 - 0.1 K/uL  HCG, QUANTITATIVE, PREGNANCY     Status: Abnormal   Collection Time   05/25/12  9:55 PM      Component Value Range   hCG, Beta Chain, [place] Longest [address] (*) <5 mIU/mL  ABO/RH     Status: Normal   Collection Time   05/25/12  9:55 PM      Component Value Range   ABO/RH(D) AB POS    WET PREP, GENITAL     Status: Abnormal   Collection Time   05/25/12 10:54 PM      Component Value Range   Yeast Wet Prep HPF POC NONE SEEN  NONE SEEN   Trich, Wet Prep NONE SEEN  NONE SEEN   Clue Cells Wet Prep HPF POC MANY (*) NONE SEEN   WBC, Wet Prep HPF POC FEW (*) NONE SEEN   Procedures US Ob Comp Less 14 Wks  05/25/2012  *RADIOLOGY REPORT*  Clinical Data: Pelvic pain for 3 weeks.  Unsure of dates. Estimated gestational age by LMP is 12 weeks 3 days.  The beta HCG is pending.  OBSTETRIC <14 WK ULTRASOUND  Technique:  Transabdominal ultrasound was performed for evaluation of the gestation as well as the maternal  uterus and adnexal regions.  Comparison:  None.  Intrauterine gestational sac: A single intrauterine gestational sac is visualized. Yolk sac: Yolk sac is present. Embryo: Fetal pole is demonstrated. Cardiac Activity: Fetal cardiac activity is visualized. Heart Rate: 176 bpm  CRL:  25.1 mm  9 w  2 d       [place] EDC: 12/25/2012  Maternal uterus/Adnexae: The uterus is anteverted.  No subchorionic hemorrhage.  Both ovaries are visualized and demonstrate normal follicular changes without abnormal adnexal mass.  Right ovary measures 2.4 x 1.2 x 1.4 cm.  Left ovary measures 4.7 x 3.2 x 2.8 cm.  No free pelvic fluid collections.  IMPRESSION: Single intrauterine pregnancy.  Estimated gestational age by crown- rump length is 9 weeks 2 days.   Original Report Authenticated By: Marlon Pel, M.D.    MDM: When patient returned from ultrasound she states she is feeling better and her pain is down to 3/10. States she thinks she had gas.   Discussed with Dr. Claiborne Billings and pt. To keep appointment in the office Assessment: Abdominal pain in pregnancy  Plan:  Follow up in the office as scheduled   Return here as needed  I have reviewed this patient's vital signs, nurses notes, appropriate labs and imaging.   Misty Foutz, RN, FNP, [facility] 05/25/2012, 11:41 PM

## 2012-05-26 LAB — GC/CHLAMYDIA PROBE AMP, GENITAL
Chlamydia, DNA Probe: NEGATIVE
GC Probe Amp, Genital: NEGATIVE

## 2012-05-27 LAB — URINE CULTURE

## 2012-06-07 DIAGNOSIS — R87619 Unspecified abnormal cytological findings in specimens from cervix uteri: Secondary | ICD-10-CM

## 2012-06-07 DIAGNOSIS — IMO0002 Reserved for concepts with insufficient information to code with codable children: Secondary | ICD-10-CM

## 2012-06-07 HISTORY — DX: Unspecified abnormal cytological findings in specimens from cervix uteri: R87.619

## 2012-06-07 HISTORY — DX: Reserved for concepts with insufficient information to code with codable children: IMO0002

## 2012-06-28 ENCOUNTER — Inpatient Hospital Stay (HOSPITAL_COMMUNITY)
Admission: AD | Admit: 2012-06-28 | Discharge: 2012-06-28 | Disposition: A | Discharge status: Hospice | Payer: 59 | Source: Ambulatory Visit | Attending: Obstetrics & Gynecology | Admitting: Obstetrics & Gynecology

## 2012-06-28 ENCOUNTER — Encounter (HOSPITAL_COMMUNITY): Payer: Self-pay | Admitting: *Deleted

## 2012-06-28 DIAGNOSIS — O210 Mild hyperemesis gravidarum: Secondary | ICD-10-CM | POA: Insufficient documentation

## 2012-06-28 DIAGNOSIS — R42 Dizziness and giddiness: Secondary | ICD-10-CM | POA: Insufficient documentation

## 2012-06-28 DIAGNOSIS — O219 Vomiting of pregnancy, unspecified: Secondary | ICD-10-CM

## 2012-06-28 HISTORY — DX: Reserved for concepts with insufficient information to code with codable children: IMO0002

## 2012-06-28 HISTORY — DX: Cardiac murmur, unspecified: R01.1

## 2012-06-28 HISTORY — DX: Anemia, unspecified: D64.9

## 2012-06-28 HISTORY — DX: Chronic kidney disease, unspecified: N18.9

## 2012-06-28 LAB — URINALYSIS, ROUTINE W REFLEX MICROSCOPIC
Bilirubin Urine: NEGATIVE
Glucose, UA: NEGATIVE mg/dL
Ketones, ur: NEGATIVE mg/dL
pH: 6 (ref 5.0–8.0)

## 2012-06-28 LAB — CBC
Platelets: 273 10*3/uL (ref 150–400)
RBC: 4 MIL/uL (ref 3.87–5.11)
WBC: 10.4 10*3/uL (ref 4.0–10.5)

## 2012-06-28 LAB — COMPREHENSIVE METABOLIC PANEL
ALT: 15 U/L (ref 0–35)
AST: 16 U/L (ref 0–37)
CO2: 23 mEq/L (ref 19–32)
Chloride: 99 mEq/L (ref 96–112)
GFR calc non Af Amer: >90 mL/min (ref >90)
Sodium: 134 mEq/L — ABNORMAL LOW (ref 135–145)
Total Bilirubin: 0.1 mg/dL — ABNORMAL LOW (ref 0.3–1.2)

## 2012-06-28 LAB — URINE MICROSCOPIC-ADD ON

## 2012-06-28 MED ORDER — PRENATAL MULTIVITAMIN CH: 1.0000 | ORAL_TABLET | Freq: Every day | ORAL | Status: DC

## 2012-06-28 MED ORDER — LACTATED RINGERS IV BOLUS (SEPSIS)
1000.0000 mL | Freq: Once | INTRAVENOUS | Status: AC
  Administered 2012-06-28: 1000 mL via INTRAVENOUS

## 2012-06-28 NOTE — MAU Provider Note (Signed)
Chief Complaint: No chief complaint on file.   First Provider Initiated Contact with Patient 06/28/12 1736     SUBJECTIVE HPI: Janice Anderson is a 36 y.o. [id number] at [redacted]w[redacted]d by LMP who presents to maternity admissions reporting fatigue x2 weeks, dizziness this morning and nausea after lunch today.  She denies vomiting but reports nausea continues and she has developed a h/a this afternoon.  She reports drinking enough fluids today but has not had enough fluids over the weekend.  She denies LOF, vaginal bleeding, vaginal itching/burning, urinary symptoms, or fever/chills.     Past Medical History  Diagnosis Date  . No pertinent past medical history   . Anemia   . Heart murmur   . Chronic kidney disease kidney stones and pyelo  . Abnormal Pap smear    Past Surgical History  Procedure Date  . No past surgeries   . Therapeutic abortion    History   Social History  . Marital Status: Single    Spouse Name: N/A    Number of Children: N/A  . Years of Education: N/A   Occupational History  . Not on file.   Social History Main Topics  . Smoking status: Never Smoker   . Smokeless tobacco: Not on file  . Alcohol Use: No  . Drug Use: No  . Sexually Active: Not Currently    Birth Control/ Protection: None   Other Topics Concern  . Not on file   Social History Narrative  . No narrative on file   No current facility-administered medications on file prior to encounter.   No current outpatient prescriptions on file prior to encounter.   No Known Allergies  ROS: Pertinent items in HPI  OBJECTIVE Blood pressure 125/54, pulse 79, temperature 98.2 F (36.8 C), temperature source Oral, resp. rate 16, height 5\' 5"  (1.651 m), weight 96.616 kg (213 lb), last menstrual period 02/28/2012. GENERAL: Well-developed, well-nourished female in no acute distress.  HEENT: Normocephalic HEART: normal rate RESP: normal effort ABDOMEN: Soft, non-tender EXTREMITIES: Nontender, no  edema NEURO: Alert and oriented SPECULUM EXAM: Deferred  LAB RESULTS Recent Results (from the past 336 hour(s))  URINALYSIS, ROUTINE W REFLEX MICROSCOPIC   Collection Time   06/28/12  5:00 PM      Component Value Range   Color, Urine YELLOW  YELLOW   APPearance HAZY (*) CLEAR   Specific Gravity, Urine 1.025  1.005 - 1.030   pH 6.0  5.0 - 8.0   Glucose, UA NEGATIVE  NEGATIVE mg/dL   Hgb urine dipstick TRACE (*) NEGATIVE   Bilirubin Urine NEGATIVE  NEGATIVE   Ketones, ur NEGATIVE  NEGATIVE mg/dL   Protein, ur NEGATIVE  NEGATIVE mg/dL   Urobilinogen, UA 0.2  0.0 - 1.0 mg/dL   Nitrite NEGATIVE  NEGATIVE   Leukocytes, UA SMALL (*) NEGATIVE  URINE MICROSCOPIC-ADD ON   Collection Time   06/28/12  5:00 PM      Component Value Range   Squamous Epithelial / LPF FEW (*) RARE   WBC, UA 3-6  <3 WBC/hpf   RBC / HPF 7-10  <3 RBC/hpf   Bacteria, UA MANY (*) RARE   Urine-Other MUCOUS PRESENT    CBC   Collection Time   06/28/12  6:55 PM      Component Value Range   WBC 10.4  4.0 - 10.5 K/uL   RBC 4.00  3.87 - 5.11 MIL/uL   Hemoglobin 11.4 (*) 12.0 - 15.0 g/dL   HCT 40.1 (*) 02.7 -  46.0 %   MCV 84.0  78.0 - 100.0 fL   MCH 28.5  26.0 - 34.0 pg   MCHC 33.9  30.0 - 36.0 g/dL   RDW 25.9  56.3 - 87.5 %   Platelets 273  150 - 400 K/uL  COMPREHENSIVE METABOLIC PANEL   Collection Time   06/28/12  6:55 PM      Component Value Range   Sodium 134 (*) 135 - 145 mEq/L   Potassium 3.5  3.5 - 5.1 mEq/L   Chloride 99  96 - 112 mEq/L   CO2 23  19 - 32 mEq/L   Glucose, Bld 89  70 - 99 mg/dL   BUN 11  6 - 23 mg/dL   Creatinine, Ser 6.43  0.50 - 1.10 mg/dL   Calcium 9.2  8.4 - 32.9 mg/dL   Total Protein 7.3  6.0 - 8.3 g/dL   Albumin 3.3 (*) 3.5 - 5.2 g/dL   AST 16  0 - 37 U/L   ALT 15  0 - 35 U/L   Alkaline Phosphatase 48  39 - 117 U/L   Total Bilirubin 0.1 (*) 0.3 - 1.2 mg/dL   GFR calc non Af Amer >90  >90 mL/min   GFR calc Af Amer >90  >90 mL/min   FHT positive by  doppler   ASSESSMENT 1. Nausea/vomiting in pregnancy   2. Dizziness     PLAN IV LR x1000 ml in MAU Called Dr Arlyce Dice to review assessment and findings Discharge home Drink plenty of fluids F/U as scheduled in office Return to MAU as needed    Medication List     As of 06/28/2012  5:48 PM    ASK your doctor about these medications         prenatal multivitamin Tabs   Take 1 tablet by mouth daily.         Sharen Counter Certified Nurse-Midwife 06/28/2012  5:48 PM

## 2012-06-28 NOTE — MAU Note (Signed)
Pt states she has been extra fatigued since this past Saturday.  She is dizzier than usual and winded when she walks down a short hall..  Denies any vomiting just nausea.

## 2012-07-07 ENCOUNTER — Ambulatory Visit (INDEPENDENT_AMBULATORY_CARE_PROVIDER_SITE_OTHER): Payer: 59 | Admitting: Obstetrics and Gynecology

## 2012-07-07 DIAGNOSIS — Z331 Pregnant state, incidental: Secondary | ICD-10-CM

## 2012-07-07 LAB — POCT URINALYSIS DIPSTICK
Bilirubin, UA: NEGATIVE
Glucose, UA: NEGATIVE
Ketones, UA: NEGATIVE
Spec Grav, UA: <=1.005

## 2012-07-07 NOTE — Progress Notes (Signed)
NOB interview. ROI signed for Pap results from [facility].  Denies UTI sx.  Advised to increase water .

## 2012-07-08 ENCOUNTER — Encounter (HOSPITAL_COMMUNITY): Payer: Self-pay | Admitting: *Deleted

## 2012-07-08 ENCOUNTER — Inpatient Hospital Stay (HOSPITAL_COMMUNITY)
Admission: AD | Admit: 2012-07-08 | Discharge: 2012-07-08 | Disposition: A | Discharge status: Home / Self Care | Payer: 59 | Source: Ambulatory Visit | Attending: Obstetrics and Gynecology | Admitting: Obstetrics and Gynecology

## 2012-07-08 DIAGNOSIS — O99891 Other specified diseases and conditions complicating pregnancy: Secondary | ICD-10-CM | POA: Insufficient documentation

## 2012-07-08 LAB — PRENATAL PANEL VII
Antibody Screen: NEGATIVE
Basophils Relative: 0 % (ref 0–1)
Eosinophils Absolute: 0.1 10*3/uL (ref 0.0–0.7)
Eosinophils Relative: 1 % (ref 0–5)
Hemoglobin: 11.8 g/dL — ABNORMAL LOW (ref 12.0–15.0)
Hepatitis B Surface Ag: NEGATIVE
MCH: 28.4 pg (ref 26.0–34.0)
MCHC: 33.8 g/dL (ref 30.0–36.0)
MCV: 83.9 fL (ref 78.0–100.0)
Monocytes Relative: 7 % (ref 3–12)
Neutrophils Relative %: 74 % (ref 43–77)
Rh Type: POSITIVE

## 2012-07-08 NOTE — MAU Note (Signed)
Woke up during the night, had a "bubbly" sensation in abd, continued during the night, less now. (like when you need to eat).  No bleeding.  Denies any GI problems.

## 2012-07-09 LAB — HEMOGLOBINOPATHY EVALUATION
Hemoglobin Other: 0 %
Hgb A2 Quant: 2.6 % (ref 2.2–3.2)
Hgb A: 97.1 % (ref 96.8–97.8)

## 2012-07-15 ENCOUNTER — Encounter: Payer: Self-pay | Admitting: Obstetrics and Gynecology

## 2012-07-15 ENCOUNTER — Ambulatory Visit (INDEPENDENT_AMBULATORY_CARE_PROVIDER_SITE_OTHER): Payer: 59 | Admitting: Obstetrics and Gynecology

## 2012-07-15 DIAGNOSIS — Z87440 Personal history of urinary (tract) infections: Secondary | ICD-10-CM

## 2012-07-15 DIAGNOSIS — Z87448 Personal history of other diseases of urinary system: Secondary | ICD-10-CM | POA: Insufficient documentation

## 2012-07-15 DIAGNOSIS — R011 Cardiac murmur, unspecified: Secondary | ICD-10-CM | POA: Insufficient documentation

## 2012-07-15 DIAGNOSIS — A749 Chlamydial infection, unspecified: Secondary | ICD-10-CM | POA: Insufficient documentation

## 2012-07-15 DIAGNOSIS — N200 Calculus of kidney: Secondary | ICD-10-CM

## 2012-07-15 DIAGNOSIS — R809 Proteinuria, unspecified: Secondary | ICD-10-CM

## 2012-07-15 DIAGNOSIS — O0930 Supervision of pregnancy with insufficient antenatal care, unspecified trimester: Secondary | ICD-10-CM

## 2012-07-15 DIAGNOSIS — Z3689 Encounter for other specified antenatal screening: Secondary | ICD-10-CM

## 2012-07-15 DIAGNOSIS — O09529 Supervision of elderly multigravida, unspecified trimester: Secondary | ICD-10-CM

## 2012-07-15 DIAGNOSIS — Z6835 Body mass index (BMI) 35.0-35.9, adult: Secondary | ICD-10-CM | POA: Insufficient documentation

## 2012-07-15 LAB — POCT URINALYSIS DIPSTICK
Bilirubin, UA: NEGATIVE
Ketones, UA: NEGATIVE

## 2012-07-15 NOTE — Progress Notes (Signed)
Pt is here today for a NOB work-up. Cultures done 05/25/2012 wnl  Pt stated she had a pap this year. Pt stated no issues today.

## 2012-07-15 NOTE — Progress Notes (Signed)
Subjective:    Janice Anderson is being seen today for her first obstetrical visit at [redacted]w[redacted]d gestation by early [place] in MAU.  She reports "feeling old" with this pregnancy--youngest child is 37 yo.  No plans for BTL after delivery, and has not used contraception "in a long time", with no pregnancy since 1996 until SAB in 4/13.    Reviewed hx of 22 week loss--reports fell in the snow in [place], taken to hospital for treatment of hypothermia.  Couldn't get warm, so heated blankets were used, then she began to sweat profusely.  Still had difficulty keeping body temperature stable, and doctor "put me on some kind of medication (? Antibiotic).  Then, several days later, they found out the baby had died".  Labor was induced after that.  Her obstetrical history is significant for: Patient Active Problem List  Diagnosis  . DEPRESSION  . IUFD at 22 weeks after hypothermia episode  . Hx kidney stones  . Hx of pyelonephritis  . Chlamydia 11/2011  . Heart murmur  No use of ATB in light of murmur  Relationship with FOB:  Declined to name  Feeding plan:   Breast  Pregnancy history fully reviewed.  The following portions of the patient's history were reviewed and updated as appropriate: allergies, current medications, past family history, past medical history, past social history, past surgical history and problem list.  Review of Systems Pertinent ROS is described in HPI   Objective:   BP 102/58  Wt 221 lb (100.245 kg)  LMP 02/28/2012 Wt Readings from Last 1 Encounters:  07/15/12 221 lb (100.245 kg)   BMI: There is no height on file to calculate BMI.  General: alert, cooperative and no distress Respiratory: clear to auscultation bilaterally Cardiovascular: regular rate and rhythm, S1, S2 normal, faint II/IV systolic murmur Breasts:  No dominant masses, nipples erect Gastrointestinal: soft, non-tender; no masses,  no organomegaly Extremities: extremities normal, no pain or edema Vaginal  Bleeding: None  EXTERNAL GENITALIA: normal appearing vulva with no masses, tenderness or lesions VAGINA: no abnormal discharge or lesions CERVIX: no lesions or cervical motion tenderness; cervix closed, long, firm UTERUS: gravid and consistent with 16 weeks ADNEXA: no masses palpable and nontender OB EXAM PELVIMETRY: appears adequate   FHR:  150  bpm  Assessment:    Pregnancy at  16 4/7 weeks AMA  Hx 22 week IUFD Hx pyelonephritis Hx kidney stones in past Plan:     Prenatal panel reviewed and discussed with the patient:  yes Pap smear collected:  Done at [facility] this year GC/Chlamydia collected:  Done in September--patient declines repeat Wet prep:  Negative Discussion of Genetic testing options: Declines Prenatal vitamins recommended Problem list reviewed and updated.  Plan of care: Next visit:  4 weeks for anatomy/Level 2 [place] Other anticipated f/u:    Glucola at [place]. Urine to culture due to 1+ proteinuria   Nigel Bridgeman, CNM, [place]

## 2012-07-28 ENCOUNTER — Other Ambulatory Visit: Payer: Self-pay | Admitting: Obstetrics and Gynecology

## 2012-07-28 ENCOUNTER — Ambulatory Visit (INDEPENDENT_AMBULATORY_CARE_PROVIDER_SITE_OTHER): Payer: 59

## 2012-07-28 ENCOUNTER — Telehealth: Payer: Self-pay | Admitting: Obstetrics and Gynecology

## 2012-07-28 ENCOUNTER — Ambulatory Visit (INDEPENDENT_AMBULATORY_CARE_PROVIDER_SITE_OTHER): Payer: 59 | Admitting: Obstetrics and Gynecology

## 2012-07-28 VITALS — BP 104/62 | Wt 222.0 lb

## 2012-07-28 DIAGNOSIS — N939 Abnormal uterine and vaginal bleeding, unspecified: Secondary | ICD-10-CM | POA: Insufficient documentation

## 2012-07-28 DIAGNOSIS — B9689 Other specified bacterial agents as the cause of diseases classified elsewhere: Secondary | ICD-10-CM

## 2012-07-28 DIAGNOSIS — A499 Bacterial infection, unspecified: Secondary | ICD-10-CM

## 2012-07-28 DIAGNOSIS — N760 Acute vaginitis: Secondary | ICD-10-CM

## 2012-07-28 DIAGNOSIS — N898 Other specified noninflammatory disorders of vagina: Secondary | ICD-10-CM

## 2012-07-28 LAB — POCT WET PREP (WET MOUNT): Clue Cells Wet Prep Whiff POC: POSITIVE

## 2012-07-28 LAB — US OB TRANSVAGINAL

## 2012-07-28 MED ORDER — METRONIDAZOLE 500 MG PO TABS: 500.0000 mg | ORAL_TABLET | Freq: Two times a day (BID) | ORAL | Status: AC

## 2012-07-28 NOTE — Progress Notes (Signed)
cx length & placenta check u/s today Posterior placenta - no previa cx closed no funneling  Several Nabothian cysts noted

## 2012-07-28 NOTE — Telephone Encounter (Signed)
Pt called, is 18w 3d, sttes went to BR and when wiping noticed brown spotting on tissue, has not had previously, pt denies any pain or cramping.  Has not had any recent IC.  Per SL pt needs an appt and [place], per VL pt worked into scheduled today @ 1345 for eval, pt voices agreement.

## 2012-07-28 NOTE — Progress Notes (Signed)
[redacted]w[redacted]d  Pt was asked if it is a "large amount of bleeding or not she stated she does not know She just noticed the discoloration when she went to the bathroom.

## 2012-07-28 NOTE — Progress Notes (Signed)
Noted brownish/red spotting this am, and had not felt much movement last few days.  Has anatomy scan 08/09/12. No pain, no recent IC. +FHR, abd gravid, NT. Pelvic--small amount white vaginal d/c. Cervix visually closed, no blood in vault, cervix firm, long. Wet prep--+ BV. Rx MTZ 500 mg po BID x 7 days, no refills. Limited US today for cervical length and placental status. US WNL, no previa, cervix long and closed Home on pelvic rest, keep [place] appt on 08/09/12 for anatomy, call prn. Declines genetic testing.

## 2012-07-28 NOTE — Addendum Note (Signed)
Addended by: Janeece Agee on: 07/28/2012 03:32 PM   Modules accepted: Orders

## 2012-08-09 ENCOUNTER — Ambulatory Visit (INDEPENDENT_AMBULATORY_CARE_PROVIDER_SITE_OTHER): Payer: 59 | Admitting: Obstetrics and Gynecology

## 2012-08-09 ENCOUNTER — Ambulatory Visit (INDEPENDENT_AMBULATORY_CARE_PROVIDER_SITE_OTHER): Payer: 59

## 2012-08-09 VITALS — BP 106/64 | Wt 222.0 lb

## 2012-08-09 DIAGNOSIS — Z3689 Encounter for other specified antenatal screening: Secondary | ICD-10-CM

## 2012-08-09 DIAGNOSIS — O09529 Supervision of elderly multigravida, unspecified trimester: Secondary | ICD-10-CM

## 2012-08-09 NOTE — Patient Instructions (Signed)
ACOG Circumcision booklet Give pt financial information about in-office circumcision Information about flu vaccine

## 2012-08-09 NOTE — Progress Notes (Signed)
Pt stated no issues today. Pt will decide on flu shot at next visit. Pt stated no issues today.  Ultrasound shows:  SIUP  S=D     [place] EDD: 12/24/12           AFI: n/a           Cervical length: 4.21 cm           Placenta localization: posterior           Fetal presentation: vertex                   Anatomy survey is normal           Gender : female Comments: Placental edge is 4.6 cm from internal OS-Normal. Fluid is small. Vertical Pocket = 7.1 cm. No anomalies seen. Normal ovaries, no fluid in CDS, normal Adnexa 1 hr glucola in 1 wk F/u visit in 4 weeks

## 2012-08-16 LAB — US OB DETAIL + 14 WK

## 2012-08-20 ENCOUNTER — Other Ambulatory Visit: Payer: 59

## 2012-08-20 DIAGNOSIS — Z331 Pregnant state, incidental: Secondary | ICD-10-CM

## 2012-08-21 LAB — GLUCOSE TOLERANCE, 1 HOUR (50G) W/O FASTING: Glucose, 1 Hour GTT: 129 mg/dL (ref 70–140)

## 2012-09-06 ENCOUNTER — Ambulatory Visit (INDEPENDENT_AMBULATORY_CARE_PROVIDER_SITE_OTHER): Payer: 59 | Admitting: Obstetrics and Gynecology

## 2012-09-06 ENCOUNTER — Encounter: Payer: Self-pay | Admitting: Obstetrics and Gynecology

## 2012-09-06 VITALS — BP 112/60 | Wt 227.0 lb

## 2012-09-06 DIAGNOSIS — Z331 Pregnant state, incidental: Secondary | ICD-10-CM

## 2012-09-06 NOTE — Patient Instructions (Signed)
Fetal Movement Counts Patient Name: __________________________________________________ Patient Due Date: ____________________ Kick counts is highly recommended in high risk pregnancies, but it is a good idea for every pregnant woman to do. Start counting fetal movements at 28 weeks of the pregnancy. Fetal movements increase after eating a full meal or eating or drinking something sweet (the blood sugar is higher). It is also important to drink plenty of fluids (well hydrated) before doing the count. Lie on your left side because it helps with the circulation or you can sit in a comfortable chair with your arms over your belly (abdomen) with no distractions around you. DOING THE COUNT  Try to do the count the same time of day each time you do it.  Mark the day and time, then see how long it takes for you to feel 10 movements (kicks, flutters, swishes, rolls). You should have at least 10 movements within 2 hours. You will most likely feel 10 movements in much less than 2 hours. If you do not, wait an hour and count again. After a couple of days you will see a pattern.  What you are looking for is a change in the pattern or not enough counts in 2 hours. Is it taking longer in time to reach 10 movements? SEEK MEDICAL CARE IF:  You feel less than 10 counts in 2 hours. Tried twice.  No movement in one hour.  The pattern is changing or taking longer each day to reach 10 counts in 2 hours.  You feel the baby is not moving as it usually does. Date: ____________ Movements: ____________ Start time: ____________ Finish time: ____________  Date: ____________ Movements: ____________ Start time: ____________ Finish time: ____________ Date: ____________ Movements: ____________ Start time: ____________ Finish time: ____________ Date: ____________ Movements: ____________ Start time: ____________ Finish time: ____________ Date: ____________ Movements: ____________ Start time: ____________ Finish time:  ____________ Date: ____________ Movements: ____________ Start time: ____________ Finish time: ____________ Date: ____________ Movements: ____________ Start time: ____________ Finish time: ____________ Date: ____________ Movements: ____________ Start time: ____________ Finish time: ____________  Date: ____________ Movements: ____________ Start time: ____________ Finish time: ____________ Date: ____________ Movements: ____________ Start time: ____________ Finish time: ____________ Date: ____________ Movements: ____________ Start time: ____________ Finish time: ____________ Date: ____________ Movements: ____________ Start time: ____________ Finish time: ____________ Date: ____________ Movements: ____________ Start time: ____________ Finish time: ____________ Date: ____________ Movements: ____________ Start time: ____________ Finish time: ____________ Date: ____________ Movements: ____________ Start time: ____________ Finish time: ____________  Date: ____________ Movements: ____________ Start time: ____________ Finish time: ____________ Date: ____________ Movements: ____________ Start time: ____________ Finish time: ____________ Date: ____________ Movements: ____________ Start time: ____________ Finish time: ____________ Date: ____________ Movements: ____________ Start time: ____________ Finish time: ____________ Date: ____________ Movements: ____________ Start time: ____________ Finish time: ____________ Date: ____________ Movements: ____________ Start time: ____________ Finish time: ____________ Date: ____________ Movements: ____________ Start time: ____________ Finish time: ____________  Date: ____________ Movements: ____________ Start time: ____________ Finish time: ____________ Date: ____________ Movements: ____________ Start time: ____________ Finish time: ____________ Date: ____________ Movements: ____________ Start time: ____________ Finish time: ____________ Date: ____________ Movements:  ____________ Start time: ____________ Finish time: ____________ Date: ____________ Movements: ____________ Start time: ____________ Finish time: ____________ Date: ____________ Movements: ____________ Start time: ____________ Finish time: ____________ Date: ____________ Movements: ____________ Start time: ____________ Finish time: ____________  Date: ____________ Movements: ____________ Start time: ____________ Finish time: ____________ Date: ____________ Movements: ____________ Start time: ____________ Finish time: ____________ Date: ____________ Movements: ____________ Start time: ____________ Finish time: ____________ Date: ____________ Movements:   ____________ Start time: ____________ Finish time: ____________ Date: ____________ Movements: ____________ Start time: ____________ Finish time: ____________ Date: ____________ Movements: ____________ Start time: ____________ Finish time: ____________ Date: ____________ Movements: ____________ Start time: ____________ Finish time: ____________  Date: ____________ Movements: ____________ Start time: ____________ Finish time: ____________ Date: ____________ Movements: ____________ Start time: ____________ Finish time: ____________ Date: ____________ Movements: ____________ Start time: ____________ Finish time: ____________ Date: ____________ Movements: ____________ Start time: ____________ Finish time: ____________ Date: ____________ Movements: ____________ Start time: ____________ Finish time: ____________ Date: ____________ Movements: ____________ Start time: ____________ Finish time: ____________ Date: ____________ Movements: ____________ Start time: ____________ Finish time: ____________  Date: ____________ Movements: ____________ Start time: ____________ Finish time: ____________ Date: ____________ Movements: ____________ Start time: ____________ Finish time: ____________ Date: ____________ Movements: ____________ Start time: ____________ Finish  time: ____________ Date: ____________ Movements: ____________ Start time: ____________ Finish time: ____________ Date: ____________ Movements: ____________ Start time: ____________ Finish time: ____________ Date: ____________ Movements: ____________ Start time: ____________ Finish time: ____________ Date: ____________ Movements: ____________ Start time: ____________ Finish time: ____________  Date: ____________ Movements: ____________ Start time: ____________ Finish time: ____________ Date: ____________ Movements: ____________ Start time: ____________ Finish time: ____________ Date: ____________ Movements: ____________ Start time: ____________ Finish time: ____________ Date: ____________ Movements: ____________ Start time: ____________ Finish time: ____________ Date: ____________ Movements: ____________ Start time: ____________ Finish time: ____________ Date: ____________ Movements: ____________ Start time: ____________ Finish time: ____________ Document Released: 10/01/2006 Document Revised: 11/24/2011 Document Reviewed: 04/03/2009 [employer] Patient Information 2013 [employer].  

## 2012-09-06 NOTE — Progress Notes (Signed)
Pt c/o occasional back pain, declines flu shot.

## 2012-09-06 NOTE — Progress Notes (Signed)
[redacted]w[redacted]d glucola @NV  Pt unable to leave a urine sample

## 2012-09-15 NOTE — L&D Delivery Note (Signed)
Delivery Note  After AROM, variables persisted and amnioinfusion was started, FHR then had had mild bradycardia to 100's, uterine tachysystole was noted, pitocin had been turned off and I was called to [facility] by RN when FHR remained in the 70's, cervix was complete and vertex at +1 station, Pt prepped for delivery, Dr Stefano Gaul also called to [facility], briefly discussed possible use of vacuum, but pt pushed well and it was not needed    At 10:24 AM a viable female was delivered via SVD by Dr Stringer/S.Leeann Must, CNM  (Presentation: ; Occiput Anterior).  Cord was immediately clamped and cut and infant handed to awaiting NICU team APGAR: 7, 8; weight 8lb  Placenta status: Intact, Spontaneous.  Sent to pathology, true knot noted in cord Cord: 3 vessels with the following complications: Knot.  Cord pH: not sent   Anesthesia: Local  Episiotomy: None Lacerations: 1st degree Suture Repair: 4-0 monocryl Est. Blood Loss (mL): 300  Mom to AICU.  Baby to nursery-stable. Pt plans to BF Will continue Mag Sulfate at 2g/hour for 24 hours  Will repeat PIH labs in the AM   Janice Anderson 12/26/2012, 11:20 AM

## 2012-09-28 ENCOUNTER — Telehealth: Payer: Self-pay | Admitting: Obstetrics and Gynecology

## 2012-09-28 NOTE — Telephone Encounter (Signed)
Pt states that she is having nasal congestion and cough. Advised pt that she can take plain sudafed, mucinex, delsym, or robitussin If symptoms worsen or pt has fever to call office.  Pt voiced understanding Darien Ramus, CMA

## 2012-10-02 ENCOUNTER — Inpatient Hospital Stay (HOSPITAL_COMMUNITY)
Admission: AD | Admit: 2012-10-02 | Discharge: 2012-10-02 | Disposition: A | Discharge status: Home / Self Care | Payer: 59 | Source: Ambulatory Visit | Attending: Obstetrics and Gynecology | Admitting: Obstetrics and Gynecology

## 2012-10-02 ENCOUNTER — Inpatient Hospital Stay (HOSPITAL_COMMUNITY): Payer: 59

## 2012-10-02 ENCOUNTER — Encounter (HOSPITAL_COMMUNITY): Payer: Self-pay | Admitting: *Deleted

## 2012-10-02 DIAGNOSIS — N939 Abnormal uterine and vaginal bleeding, unspecified: Secondary | ICD-10-CM

## 2012-10-02 DIAGNOSIS — Z6835 Body mass index (BMI) 35.0-35.9, adult: Secondary | ICD-10-CM

## 2012-10-02 DIAGNOSIS — O4690 Antepartum hemorrhage, unspecified, unspecified trimester: Secondary | ICD-10-CM

## 2012-10-02 DIAGNOSIS — N200 Calculus of kidney: Secondary | ICD-10-CM

## 2012-10-02 DIAGNOSIS — R011 Cardiac murmur, unspecified: Secondary | ICD-10-CM

## 2012-10-02 DIAGNOSIS — A749 Chlamydial infection, unspecified: Secondary | ICD-10-CM

## 2012-10-02 DIAGNOSIS — O0930 Supervision of pregnancy with insufficient antenatal care, unspecified trimester: Secondary | ICD-10-CM

## 2012-10-02 DIAGNOSIS — Z87448 Personal history of other diseases of urinary system: Secondary | ICD-10-CM

## 2012-10-02 LAB — URINALYSIS, ROUTINE W REFLEX MICROSCOPIC
Bilirubin Urine: NEGATIVE
Nitrite: NEGATIVE
Specific Gravity, Urine: 1.025 (ref 1.005–1.030)
Urobilinogen, UA: 0.2 mg/dL (ref 0.0–1.0)
pH: 6 (ref 5.0–8.0)

## 2012-10-02 LAB — URINE MICROSCOPIC-ADD ON

## 2012-10-02 MED ORDER — NIFEDIPINE 10 MG PO CAPS
10.0000 mg | ORAL_CAPSULE | ORAL | Status: DC | PRN
  Administered 2012-10-02: 10 mg via ORAL
  Filled 2012-10-02: qty 1

## 2012-10-02 MED ORDER — NIFEDIPINE 10 MG PO CAPS: 10.0000 mg | ORAL_CAPSULE | Freq: Four times a day (QID) | ORAL | Status: DC | PRN

## 2012-10-02 NOTE — MAU Provider Note (Signed)
History   37 yo [id number] at 90 6/7 weeks presented unannounced c/o dark blood upon wiping and periumbilical pain x 1.  No IC since July.  Denies leaking, reports +FM.  Patient Active Problem List  Diagnosis  . DEPRESSION  . IUFD at 22 weeks after hypothermia episode  . Hx kidney stones  . Hx of pyelonephritis  . Chlamydia 11/2011  . Heart murmur  . Late prenatal care  . BMI 35.0-35.9,adult  . Vaginal bleeding at 20 weeks     Chief Complaint  Patient presents with  . Vaginal Bleeding    OB History    Grav Para Term Preterm Abortions TAB SAB Ect Mult Living   7 3 2 1 3 1 2   2       Past Medical History  Diagnosis Date  . No pertinent past medical history   . Anemia   . Chronic kidney disease kidney stones and pyelo  . Heart murmur     SINCE BIRTH  . Abnormal Pap smear 06/07/2012  . Preterm labor 1994    STILLBIRTH  . Infection     KIDNEY OCC  . Infection 11/2011    CHLAMYDIA    Past Surgical History  Procedure Date  . Therapeutic abortion   . Dilation and curettage of uterus     Family History  Problem Relation Age of Onset  . Diabetes Other   . Other Neg Hx   . Diabetes Maternal Uncle   . Diabetes Maternal Grandmother   . Alcohol abuse Father   . Diabetes Maternal Uncle   . Birth defects Other     HEART DEFECT  . Early death Other     DIED AT AGE 55 MONTHS    History  Substance Use Topics  . Smoking status: Never Smoker   . Smokeless tobacco: Never Used  . Alcohol Use: No    Allergies: No Known Allergies  Prescriptions prior to admission  Medication Sig Dispense Refill  . Prenatal Vit-Fe Fumarate-FA (PRENATAL MULTIVITAMIN) TABS Take 1 tablet by mouth daily.  30 tablet  5     Physical Exam   Blood pressure 142/67, pulse 99, temperature 98.2 F (36.8 C), temperature source Oral, resp. rate 18, height 5' 4.5" (1.638 m), weight 230 lb 9.6 oz (104.599 kg), last menstrual period 02/28/2012.  Chest clear Heart RRR without murmur Abd gravid,  NT Pelvic--small amount dark blood in vault, cervix ext os ft/1cm, internal os closed, long--multiparous cx Ext WNL  FHR  Reassuring for EGA UCs--none noted at present.  ED Course  IUP at 27 6/7 weeks 3rd trimester bleeding  Plan: Monitor FFN GC, chlamydia, wet prep.   Nigel Bridgeman CNM, [place] 10/02/2012 2:57 PM   Addendum: Returned from [place]: Vtx, 27 6/7 weeks, EFW 1359 gm, 3 lbs, 77%ile. Placenta posterior, no evidence previa or abruption. Normal AFI Cervix 2.9 cm  FHR reassuring, no decels, Uterine irritability noted.  Consulted with Dr. Su Hilt Procardia 10 mg po q 20 min x 4 doses. Will evaluate after med. Anticipate d/c home, to return to office on Monday for FFN (placed on my schedule) OOW Monday, note written.   Nigel Bridgeman, CNM, [place] 10/02/12 5:15p  Addendum: Decrease in irritability after 1st dose of Procardia. Home as above.  Nigel Bridgeman, CNM 10/02/12 5:45p

## 2012-10-02 NOTE — MAU Note (Signed)
Pt reports she had some vag bleeding this morning and cramping. Fetal movement less than usual not felt baby since this morning.

## 2012-10-03 LAB — URINE CULTURE

## 2012-10-04 ENCOUNTER — Ambulatory Visit: Payer: 59 | Admitting: Obstetrics and Gynecology

## 2012-10-04 VITALS — BP 110/60 | Wt 230.0 lb

## 2012-10-04 DIAGNOSIS — N898 Other specified noninflammatory disorders of vagina: Secondary | ICD-10-CM

## 2012-10-04 LAB — FETAL FIBRONECTIN: Fetal Fibronectin: NEGATIVE

## 2012-10-04 NOTE — Progress Notes (Signed)
Seen at MAU on Saturday for vaginal bleeding--cervix 2.9 cm by [place]. [place] -- Growth and fluid wnl On Procardia prn--has not used. Here for FFN and recheck of cervix (was ext os 1 cm, internal os closed/long on Saturday). Still had brownish dc, no cx, fFN done, cervix unchanged. Reviewed R&B of Betamethasone - pt prefers to proceed with this. Orders sent to hospital. Keep sch apt for Friday with glucola If fFN is pos--could be secondary to bleeding-will consider repeating in two weeks.

## 2012-10-04 NOTE — Progress Notes (Signed)
Pt stated been having brown discharge . Per VL FFN today.pt stated she cant void at this time will give pt water.  Pt stated no other issues today.

## 2012-10-05 ENCOUNTER — Telehealth: Payer: Self-pay | Admitting: Obstetrics and Gynecology

## 2012-10-05 NOTE — Telephone Encounter (Signed)
TC to pt.  TC to pt. LM to return call.  Or if unable  To reach office to go to [facility].  Also spoke with pt's mother who states pt may not be able to receive phone calls at work.  Will try to contact pt and have her call office or after hours #.

## 2012-10-05 NOTE — Telephone Encounter (Signed)
TC to pt. LM to return call.  

## 2012-10-05 NOTE — Telephone Encounter (Signed)
TC from pt.  Per VL informed FFN neg but Betamethazone is still advised. Pt states she did not have transportation.  Advised to go ASAP.  Does not need appt. Pt verbalizes comprehension and will try to obtain transportation. Denies any problems currently. States +FM

## 2012-10-05 NOTE — Telephone Encounter (Signed)
Message copied by Mason Jim on Tue Oct 05, 2012  8:15 AM ------      Message from: Cornelius Moras      Created: Mon Oct 04, 2012  6:22 PM       Negative FFN--please notify patient.      Keep same plan for completion of 2 doses Betamethasone.      Thanks!      VL

## 2012-10-05 NOTE — Telephone Encounter (Signed)
Message copied by Mason Jim on Tue Oct 05, 2012 10:48 AM ------      Message from: Cornelius Moras      Created: Mon Oct 04, 2012  6:22 PM       Negative FFN--please notify patient.      Keep same plan for completion of 2 doses Betamethasone.      Thanks!      VL

## 2012-10-05 NOTE — Telephone Encounter (Signed)
TC to pt. LM to return call asap.

## 2012-10-06 ENCOUNTER — Inpatient Hospital Stay (HOSPITAL_COMMUNITY)
Admission: AD | Admit: 2012-10-06 | Discharge: 2012-10-06 | Disposition: A | Discharge status: Home / Self Care | Payer: 59 | Source: Ambulatory Visit | Attending: Obstetrics and Gynecology | Admitting: Obstetrics and Gynecology

## 2012-10-06 DIAGNOSIS — O4700 False labor before 37 completed weeks of gestation, unspecified trimester: Secondary | ICD-10-CM | POA: Insufficient documentation

## 2012-10-06 MED ORDER — BETAMETHASONE SOD PHOS & ACET 6 (3-3) MG/ML IJ SUSP
12.0000 mg | Freq: Once | INTRAMUSCULAR | Status: AC
  Administered 2012-10-06: 12 mg via INTRAMUSCULAR
  Filled 2012-10-06: qty 2

## 2012-10-07 ENCOUNTER — Inpatient Hospital Stay (HOSPITAL_COMMUNITY)
Admission: AD | Admit: 2012-10-07 | Discharge: 2012-10-07 | Disposition: A | Discharge status: Home / Self Care | Payer: 59 | Source: Ambulatory Visit | Attending: Obstetrics and Gynecology | Admitting: Obstetrics and Gynecology

## 2012-10-07 DIAGNOSIS — O4700 False labor before 37 completed weeks of gestation, unspecified trimester: Secondary | ICD-10-CM | POA: Insufficient documentation

## 2012-10-07 MED ORDER — BETAMETHASONE SOD PHOS & ACET 6 (3-3) MG/ML IJ SUSP
12.0000 mg | Freq: Once | INTRAMUSCULAR | Status: AC
  Administered 2012-10-07: 12 mg via INTRAMUSCULAR
  Filled 2012-10-07: qty 2

## 2012-10-08 ENCOUNTER — Encounter: Payer: Self-pay | Admitting: Obstetrics and Gynecology

## 2012-10-08 ENCOUNTER — Encounter: Payer: 59 | Admitting: Obstetrics and Gynecology

## 2012-10-18 NOTE — Progress Notes (Signed)
This encounter was created in error - please disregard.

## 2012-11-12 ENCOUNTER — Encounter: Payer: Self-pay | Admitting: Obstetrics and Gynecology

## 2012-11-12 ENCOUNTER — Ambulatory Visit: Payer: 59 | Admitting: Obstetrics and Gynecology

## 2012-11-12 VITALS — BP 112/60 | Wt 233.0 lb

## 2012-11-12 DIAGNOSIS — Z331 Pregnant state, incidental: Secondary | ICD-10-CM

## 2012-11-12 NOTE — Progress Notes (Signed)
[redacted]w[redacted]d Pt c/o Bilateral leg pain NO C?C B.  Neg homans sign B.  Tr edema B Recommend streteching Pt declined PT

## 2012-11-12 NOTE — Progress Notes (Signed)
Pt c/o leg and foot pain.

## 2012-11-26 ENCOUNTER — Ambulatory Visit: Payer: 59 | Admitting: Obstetrics and Gynecology

## 2012-11-26 ENCOUNTER — Encounter: Payer: 59 | Admitting: Family Medicine

## 2012-11-26 VITALS — BP 136/68 | Wt 244.5 lb

## 2012-11-26 DIAGNOSIS — Z331 Pregnant state, incidental: Secondary | ICD-10-CM

## 2012-11-26 DIAGNOSIS — N12 Tubulo-interstitial nephritis, not specified as acute or chronic: Secondary | ICD-10-CM

## 2012-11-26 DIAGNOSIS — O0932 Supervision of pregnancy with insufficient antenatal care, second trimester: Secondary | ICD-10-CM

## 2012-11-26 DIAGNOSIS — Z6835 Body mass index (BMI) 35.0-35.9, adult: Secondary | ICD-10-CM

## 2012-11-26 DIAGNOSIS — O26843 Uterine size-date discrepancy, third trimester: Secondary | ICD-10-CM

## 2012-11-26 DIAGNOSIS — E669 Obesity, unspecified: Secondary | ICD-10-CM

## 2012-11-26 LAB — POCT URINALYSIS DIPSTICK
Bilirubin, UA: NEGATIVE
Blood, UA: 250
Glucose, UA: NEGATIVE
Nitrite, UA: NEGATIVE
Spec Grav, UA: 1.01
pH, UA: 7

## 2012-11-26 NOTE — Progress Notes (Signed)
[redacted]w[redacted]d  Had nl 1hr glucola at 22 wks.  Now with s>d.  U/s NV for EFW Random glu=134 about an hour after OJ.  Will check FBS at nv gbs done. Pt c/o inc vaginal pressure x 1wk causing difficulty with walking. Pt denies ha's, dizziness, or visual changes.  C/o inc fatigue. hgb and fbs at NV

## 2012-11-26 NOTE — Addendum Note (Signed)
Addended by: Loralyn Freshwater on: 11/26/2012 12:43 PM   Modules accepted: Orders

## 2012-11-26 NOTE — Addendum Note (Signed)
Addended by: Lerry Liner D on: 11/26/2012 01:07 PM   Modules accepted: Orders

## 2012-12-03 ENCOUNTER — Ambulatory Visit: Payer: 59 | Admitting: Family Medicine

## 2012-12-03 ENCOUNTER — Ambulatory Visit: Payer: 59

## 2012-12-03 VITALS — BP 110/70 | Wt 244.0 lb

## 2012-12-03 DIAGNOSIS — Z8639 Personal history of other endocrine, nutritional and metabolic disease: Secondary | ICD-10-CM

## 2012-12-03 DIAGNOSIS — O26843 Uterine size-date discrepancy, third trimester: Secondary | ICD-10-CM

## 2012-12-03 DIAGNOSIS — Z331 Pregnant state, incidental: Secondary | ICD-10-CM

## 2012-12-03 LAB — US OB FOLLOW UP

## 2012-12-03 NOTE — Progress Notes (Signed)
[redacted]w[redacted]d Complains of pain underneath navel. Finger stick blood suger---77 Ultrasound shows:  SIUP  S=D     [place] EDD: 12/26/2012           AFI:16.3 cm=60th%tile           Cervix not evaluated           Placenta localization: posterior           Fetal presentation: Vertex/Single Fetus                                 EFW=82nd%tile                                 3238 grams/7.1lbs/Normal Adenxa

## 2012-12-03 NOTE — Progress Notes (Signed)
[redacted]w[redacted]d  Random glucola 77 (6 hours postprandial).  U/S discussed, patient without questions. GBS (-). Denies fatigue today, will repeat H/H at next visit.  She plans to have FMLA to come out of work at 38 weeks. ROB in 1 week. L.Calianne Larue, FNP-BC

## 2012-12-14 ENCOUNTER — Inpatient Hospital Stay (HOSPITAL_COMMUNITY): Payer: 59

## 2012-12-14 ENCOUNTER — Encounter (HOSPITAL_COMMUNITY): Payer: Self-pay | Admitting: *Deleted

## 2012-12-14 ENCOUNTER — Inpatient Hospital Stay (HOSPITAL_COMMUNITY)
Admission: AD | Admit: 2012-12-14 | Discharge: 2012-12-14 | Disposition: A | Discharge status: Home / Self Care | Payer: 59 | Source: Ambulatory Visit | Attending: Obstetrics and Gynecology | Admitting: Obstetrics and Gynecology

## 2012-12-14 DIAGNOSIS — O368190 Decreased fetal movements, unspecified trimester, not applicable or unspecified: Secondary | ICD-10-CM | POA: Insufficient documentation

## 2012-12-14 DIAGNOSIS — O368139 Decreased fetal movements, third trimester, other fetus: Secondary | ICD-10-CM

## 2012-12-14 NOTE — MAU Note (Signed)
Pt states has been up since 0200, has not felt movement, yesterday baby was very active. FHR spot checked in triage, FHR 137

## 2012-12-14 NOTE — MAU Provider Note (Signed)
History  37 yo [id number] at [redacted]w[redacted]d presents unannounced to MAU for decreased FM.  Pt is visibly distressed d/t history of SAB x2.  Pt reported the "baby was very active yesterday" but has not felt the baby move since at least 2:00 am. Denies VB, LOF, cramping; but notes lower back aching.  TAB 1992 at 6w Preterm 1994 at 22w SAB 1995 at 3w SAB 2013   Chief Complaint  Patient presents with  . Decreased Fetal Movement    OB History   Grav Para Term Preterm Abortions TAB SAB Ect Mult Living   7 3 2 1 3 1 2   2       Past Medical History  Diagnosis Date  . No pertinent past medical history   . Anemia   . Chronic kidney disease kidney stones and pyelo  . Heart murmur     SINCE BIRTH  . Abnormal Pap smear 06/07/2012  . Preterm labor 1994    STILLBIRTH  . Infection     KIDNEY OCC  . Infection 11/2011    CHLAMYDIA    Past Surgical History  Procedure Laterality Date  . Therapeutic abortion    . Dilation and curettage of uterus      Family History  Problem Relation Age of Onset  . Diabetes Other   . Other Neg Hx   . Diabetes Maternal Uncle   . Diabetes Maternal Grandmother   . Alcohol abuse Father   . Diabetes Maternal Uncle   . Birth defects Other     HEART DEFECT  . Early death Other     DIED AT AGE 75 MONTHS    History  Substance Use Topics  . Smoking status: Never Smoker   . Smokeless tobacco: Never Used  . Alcohol Use: No    Allergies: No Known Allergies  Prescriptions prior to admission  Medication Sig Dispense Refill  . guaiFENesin (ROBITUSSIN) 100 MG/5ML liquid Take 200 mg by mouth 3 (three) times daily as needed. For cold      . NIFEdipine (PROCARDIA) 10 MG capsule Take 1 capsule (10 mg total) by mouth every 6 (six) hours as needed (PTL).  50 capsule  1  . Prenatal Vit-Fe Fumarate-FA (PRENATAL MULTIVITAMIN) TABS Take 1 tablet by mouth daily.  30 tablet  5   ROS - see HPI above  Physical Exam   Blood pressure 140/56, pulse 69, temperature 98.5 F (36.9  C), temperature source Oral, resp. rate 18, height 5\' 4"  (1.626 m), weight 249 lb 8 oz (113.172 kg), last menstrual period 02/28/2012.  Chest: clear Heart: RRR without murmur Extremities: WNL  UCs: none FHT: Cat I  BPP 8/8  ED Course  A: IUP at [redacted]w[redacted]d Reported decreased FM  P: BPP 8/8 Rv'd fetal kick count Offered emotional support and gave reassurance  Pt to call with any other concerns prn    Haroldine Laws CNM, MSN 12/14/2012 2:53 PM

## 2012-12-22 ENCOUNTER — Encounter (HOSPITAL_COMMUNITY): Payer: Self-pay

## 2012-12-22 ENCOUNTER — Inpatient Hospital Stay (HOSPITAL_COMMUNITY): Payer: 59

## 2012-12-22 ENCOUNTER — Inpatient Hospital Stay (HOSPITAL_COMMUNITY)
Admission: AD | Admit: 2012-12-22 | Discharge: 2012-12-22 | Disposition: A | Discharge status: Home / Self Care | Payer: 59 | Source: Ambulatory Visit | Attending: Obstetrics and Gynecology | Admitting: Obstetrics and Gynecology

## 2012-12-22 DIAGNOSIS — N939 Abnormal uterine and vaginal bleeding, unspecified: Secondary | ICD-10-CM

## 2012-12-22 DIAGNOSIS — E669 Obesity, unspecified: Secondary | ICD-10-CM | POA: Insufficient documentation

## 2012-12-22 DIAGNOSIS — Z87448 Personal history of other diseases of urinary system: Secondary | ICD-10-CM

## 2012-12-22 DIAGNOSIS — Z6835 Body mass index (BMI) 35.0-35.9, adult: Secondary | ICD-10-CM

## 2012-12-22 DIAGNOSIS — R011 Cardiac murmur, unspecified: Secondary | ICD-10-CM

## 2012-12-22 DIAGNOSIS — O471 False labor at or after 37 completed weeks of gestation: Secondary | ICD-10-CM

## 2012-12-22 DIAGNOSIS — N200 Calculus of kidney: Secondary | ICD-10-CM

## 2012-12-22 DIAGNOSIS — O99210 Obesity complicating pregnancy, unspecified trimester: Secondary | ICD-10-CM | POA: Insufficient documentation

## 2012-12-22 DIAGNOSIS — A749 Chlamydial infection, unspecified: Secondary | ICD-10-CM

## 2012-12-22 DIAGNOSIS — O479 False labor, unspecified: Secondary | ICD-10-CM | POA: Insufficient documentation

## 2012-12-22 NOTE — MAU Note (Signed)
Unsure if having ctx's, no bleeding, noted mucous like vag d/c.

## 2012-12-22 NOTE — MAU Note (Signed)
Patient states she has been having irregular contractions with "cramps" since early am hours. Denies leaking or bleeding and reports good fetal movement.

## 2012-12-22 NOTE — MAU Provider Note (Signed)
History   Janice Anderson is a 37y.o. Obese BF at [redacted]w[redacted]d who presents unannounced for labor check.  She reports irregular ctxs since 0400.  Denies LOF or Vb.  Reports urinary frequency, but primarily c/w ctxs today.  No fever or chills.  No other UTI s/s.  No PIH s/s.  Denies LOF or VB.  Hasn't eaten since lunch.  Fetal movement she reports has been less over the last week.  Seen in MAU she reports 'last Tues" and had BPP.  Reports no family in area, so a friend brought her to MAU.  She is not employed and reports a lot of musculoskeletal pain recently, so she mainly "lays around the house" most of the day. She does report some dizziness w/ standing at times.   .. Patient Active Problem List  Diagnosis  . DEPRESSION  . IUFD at 22 weeks after hypothermia episode  . Hx kidney stones  . Hx of pyelonephritis  . Chlamydia 11/2011  . Heart murmur  . Late prenatal care  . BMI 35.0-35.9,adult  . Vaginal bleeding at 20 weeks    CSN: [id number]  Arrival date and time: 12/22/12 4098   First Provider Initiated Contact with Patient 12/22/12 1953      Chief Complaint  Patient presents with  . Labor Eval   HPI  OB History   Grav Para Term Preterm Abortions TAB SAB Ect Mult Living   7 3 2 1 3 1 2   2       Past Medical History  Diagnosis Date  . No pertinent past medical history   . Anemia   . Chronic kidney disease kidney stones and pyelo  . Heart murmur     SINCE BIRTH  . Abnormal Pap smear 06/07/2012  . Preterm labor 1994    STILLBIRTH  . Infection     KIDNEY OCC  . Infection 11/2011    CHLAMYDIA    Past Surgical History  Procedure Laterality Date  . Therapeutic abortion    . Dilation and curettage of uterus    . Wisdom tooth extraction      Family History  Problem Relation Age of Onset  . Diabetes Other   . Other Neg Hx   . Diabetes Maternal Uncle   . Diabetes Maternal Grandmother   . Alcohol abuse Father   . Diabetes Maternal Uncle   . Birth defects Other     HEART  DEFECT  . Early death Other     DIED AT AGE 15 MONTHS    History  Substance Use Topics  . Smoking status: Never Smoker   . Smokeless tobacco: Never Used  . Alcohol Use: No    Allergies: No Known Allergies  Prescriptions prior to admission  Medication Sig Dispense Refill  . Prenatal Vit-Fe Fumarate-FA (PRENATAL MULTIVITAMIN) TABS Take 1 tablet by mouth daily.  30 tablet  5    ROS--see HPI Physical Exam   Blood pressure 152/59, pulse 77, temperature 98.2 F (36.8 C), temperature source Oral, resp. rate 20, height 5\' 4"  (1.626 m), weight 252 lb 12.8 oz (114.669 kg), last menstrual period 02/28/2012, SpO2 100.00%.  Physical Exam  Constitutional: She is oriented to person, place, and time. She appears well-developed and well-nourished. No distress.  HENT:  Head: Normocephalic.  Eyes: Pupils are equal, round, and reactive to light.  Cardiovascular: Normal rate.   Respiratory: Effort normal.  GI: Soft.  gravid  Genitourinary:  Cx: loose 1cm/long/ballotable, anterior  Neurological: She is alert and oriented  to person, place, and time.  Skin: Skin is warm and dry.    MAU Course  Procedures 1. NST: 130, reactive, one decel for 2 min into 90s at 1840 & pt on her back;none since; moderate variability; TOCO: rare ctx. 2. Limited OB u/s for BPP: Vtx, AFI subjectively WNL; 6/8 BPP (only 2 gross fetal movements noted in 30 min and 3 necessary for 2/2)  Assessment and Plan  1. [redacted]w[redacted]d 2. False labor 3. Isolated decel on NST, prompting BPP with 8/10 overall antenatal testing 4. GBS neg 5. Reactive tracing before and after decel  1. D/c home per c/w Dr. Stefano Gaul after u/s 2. Pt has appt day after tomorrow, but offered pt to come by office tomorrow for FHT/NST if desires, and pt undecided presently, but will come if FM is decreased tomorrow 3. Rev'd FKC and labor precautions at length and given written education material on both 4. F/u tomorrow if desires, keep 12/24/12 appt, or f/u  prn  Randy Whitener H 12/22/2012, 9:50 PM

## 2012-12-24 ENCOUNTER — Other Ambulatory Visit: Payer: Self-pay

## 2012-12-25 ENCOUNTER — Inpatient Hospital Stay (HOSPITAL_COMMUNITY)
Admission: AD | Admit: 2012-12-25 | Discharge: 2012-12-25 | Disposition: A | Discharge status: Home / Self Care | Payer: 59 | Source: Ambulatory Visit | Attending: Obstetrics and Gynecology | Admitting: Obstetrics and Gynecology

## 2012-12-25 DIAGNOSIS — N939 Abnormal uterine and vaginal bleeding, unspecified: Secondary | ICD-10-CM

## 2012-12-25 DIAGNOSIS — Z6835 Body mass index (BMI) 35.0-35.9, adult: Secondary | ICD-10-CM

## 2012-12-25 DIAGNOSIS — R011 Cardiac murmur, unspecified: Secondary | ICD-10-CM

## 2012-12-25 DIAGNOSIS — A749 Chlamydial infection, unspecified: Secondary | ICD-10-CM

## 2012-12-25 DIAGNOSIS — N200 Calculus of kidney: Secondary | ICD-10-CM

## 2012-12-25 DIAGNOSIS — O0933 Supervision of pregnancy with insufficient antenatal care, third trimester: Secondary | ICD-10-CM

## 2012-12-25 DIAGNOSIS — R030 Elevated blood-pressure reading, without diagnosis of hypertension: Secondary | ICD-10-CM | POA: Insufficient documentation

## 2012-12-25 DIAGNOSIS — O4690 Antepartum hemorrhage, unspecified, unspecified trimester: Secondary | ICD-10-CM | POA: Insufficient documentation

## 2012-12-25 DIAGNOSIS — Z87448 Personal history of other diseases of urinary system: Secondary | ICD-10-CM

## 2012-12-25 DIAGNOSIS — O99891 Other specified diseases and conditions complicating pregnancy: Secondary | ICD-10-CM | POA: Insufficient documentation

## 2012-12-25 LAB — PROTEIN, URINE, 24 HOUR
Protein, 24H Urine: 456 mg/d — ABNORMAL HIGH (ref 50–100)
Urine Total Volume-UPROT: 1200 mL

## 2012-12-25 NOTE — MAU Provider Note (Signed)
37 yo [id number] at 39 6/7 weeks here to drop off 24 hour urine and for BP check. Seen at office yesterday by Dr. Pennie Rushing, with 1+ proteinuria, BP 150/80, 2+ edema. Denies HA, visual sx, or epigastric pain. Reports +FM, denies leaking or bleeding, only occasional contractions.  BP 142/75, P 90, R 18 FHR 150.  24 hour urine sent STAT for protein. PIH precautions reviewed. Advised patient we would notify her regarding results. If patient does not require admission from these results, will need appt scheduled at office this week (currently doesn't have appt this week).  Nigel Bridgeman, CNM 12/25/12 4:45p

## 2012-12-25 NOTE — MAU Note (Signed)
Pt here for bp check

## 2012-12-26 ENCOUNTER — Inpatient Hospital Stay (HOSPITAL_COMMUNITY)
Admission: AD | Admit: 2012-12-26 | Discharge: 2012-12-28 | DRG: 774 | Disposition: A | Discharge status: Home / Self Care | Payer: 59 | Source: Ambulatory Visit | Attending: Obstetrics and Gynecology | Admitting: Obstetrics and Gynecology

## 2012-12-26 ENCOUNTER — Encounter (HOSPITAL_COMMUNITY): Payer: Self-pay

## 2012-12-26 ENCOUNTER — Inpatient Hospital Stay (HOSPITAL_COMMUNITY): Payer: 59 | Admitting: Anesthesiology

## 2012-12-26 ENCOUNTER — Encounter (HOSPITAL_COMMUNITY): Payer: Self-pay | Admitting: Anesthesiology

## 2012-12-26 DIAGNOSIS — O1490 Unspecified pre-eclampsia, unspecified trimester: Secondary | ICD-10-CM

## 2012-12-26 DIAGNOSIS — Z87448 Personal history of other diseases of urinary system: Secondary | ICD-10-CM

## 2012-12-26 DIAGNOSIS — Z6835 Body mass index (BMI) 35.0-35.9, adult: Secondary | ICD-10-CM

## 2012-12-26 DIAGNOSIS — A749 Chlamydial infection, unspecified: Secondary | ICD-10-CM

## 2012-12-26 DIAGNOSIS — N939 Abnormal uterine and vaginal bleeding, unspecified: Secondary | ICD-10-CM

## 2012-12-26 DIAGNOSIS — R011 Cardiac murmur, unspecified: Secondary | ICD-10-CM

## 2012-12-26 DIAGNOSIS — N200 Calculus of kidney: Secondary | ICD-10-CM

## 2012-12-26 DIAGNOSIS — IMO0002 Reserved for concepts with insufficient information to code with codable children: Principal | ICD-10-CM | POA: Diagnosis present

## 2012-12-26 HISTORY — DX: Unspecified pre-eclampsia, unspecified trimester: O14.90

## 2012-12-26 LAB — COMPREHENSIVE METABOLIC PANEL
CO2: 21 mEq/L (ref 19–32)
Calcium: 8.9 mg/dL (ref 8.4–10.5)
Chloride: 103 mEq/L (ref 96–112)
Creatinine, Ser: 0.84 mg/dL (ref 0.50–1.10)
GFR calc Af Amer: >90 mL/min (ref >90)
GFR calc non Af Amer: 88 mL/min — ABNORMAL LOW (ref >90)
Glucose, Bld: 97 mg/dL (ref 70–99)
Total Bilirubin: 0.3 mg/dL (ref 0.3–1.2)

## 2012-12-26 LAB — URIC ACID: Uric Acid, Serum: 8.5 mg/dL — ABNORMAL HIGH (ref 2.4–7.0)

## 2012-12-26 LAB — CBC
HCT: 32.2 % — ABNORMAL LOW (ref 36.0–46.0)
Hemoglobin: 10.9 g/dL — ABNORMAL LOW (ref 12.0–15.0)
MCH: 27.9 pg (ref 26.0–34.0)
MCV: 82.6 fL (ref 78.0–100.0)
RBC: 3.9 MIL/uL (ref 3.87–5.11)
WBC: 10.9 10*3/uL — ABNORMAL HIGH (ref 4.0–10.5)

## 2012-12-26 LAB — MRSA PCR SCREENING: MRSA by PCR: NEGATIVE

## 2012-12-26 LAB — RPR: RPR Ser Ql: NONREACTIVE

## 2012-12-26 LAB — TYPE AND SCREEN

## 2012-12-26 MED ORDER — LANOLIN HYDROUS EX OINT: TOPICAL_OINTMENT | CUTANEOUS | Status: DC | PRN

## 2012-12-26 MED ORDER — SENNOSIDES-DOCUSATE SODIUM 8.6-50 MG PO TABS
2.0000 | ORAL_TABLET | Freq: Every day | ORAL | Status: DC
  Administered 2012-12-26 – 2012-12-27 (×2): 2 via ORAL

## 2012-12-26 MED ORDER — OXYCODONE-ACETAMINOPHEN 5-325 MG PO TABS
1.0000 | ORAL_TABLET | ORAL | Status: DC | PRN
  Filled 2012-12-26: qty 1

## 2012-12-26 MED ORDER — OXYTOCIN BOLUS FROM INFUSION: 500.0000 mL | INTRAVENOUS | Status: DC

## 2012-12-26 MED ORDER — BENZOCAINE-MENTHOL 20-0.5 % EX AERO: 1.0000 "application " | INHALATION_SPRAY | CUTANEOUS | Status: DC | PRN

## 2012-12-26 MED ORDER — ZOLPIDEM TARTRATE 5 MG PO TABS
5.0000 mg | ORAL_TABLET | Freq: Every evening | ORAL | Status: DC | PRN
  Administered 2012-12-26: 5 mg via ORAL
  Filled 2012-12-26: qty 1

## 2012-12-26 MED ORDER — IBUPROFEN 600 MG PO TABS
600.0000 mg | ORAL_TABLET | Freq: Four times a day (QID) | ORAL | Status: DC
  Administered 2012-12-26 – 2012-12-28 (×4): 600 mg via ORAL
  Filled 2012-12-26 (×7): qty 1

## 2012-12-26 MED ORDER — LACTATED RINGERS IV SOLN
500.0000 mL | INTRAVENOUS | Status: DC | PRN
  Administered 2012-12-26: 500 mL via INTRAVENOUS
  Administered 2012-12-26: 300 mL via INTRAVENOUS

## 2012-12-26 MED ORDER — PHENYLEPHRINE 40 MCG/ML (10ML) SYRINGE FOR IV PUSH (FOR BLOOD PRESSURE SUPPORT)
80.0000 ug | PREFILLED_SYRINGE | INTRAVENOUS | Status: DC | PRN
  Filled 2012-12-26: qty 2

## 2012-12-26 MED ORDER — ACETAMINOPHEN 325 MG PO TABS: 650.0000 mg | ORAL_TABLET | ORAL | Status: DC | PRN

## 2012-12-26 MED ORDER — MAGNESIUM SULFATE BOLUS VIA INFUSION
4.0000 g | Freq: Once | INTRAVENOUS | Status: AC
  Administered 2012-12-26: 4 g via INTRAVENOUS
  Filled 2012-12-26: qty 500

## 2012-12-26 MED ORDER — OXYTOCIN 40 UNITS IN LACTATED RINGERS INFUSION - SIMPLE MED
62.5000 mL/h | INTRAVENOUS | Status: DC
  Administered 2012-12-26: 62.5 mL/h via INTRAVENOUS

## 2012-12-26 MED ORDER — TERBUTALINE SULFATE 1 MG/ML IJ SOLN
INTRAMUSCULAR | Status: AC
  Administered 2012-12-26: 0.25 mg via SUBCUTANEOUS
  Filled 2012-12-26: qty 1

## 2012-12-26 MED ORDER — SIMETHICONE 80 MG PO CHEW: 80.0000 mg | CHEWABLE_TABLET | ORAL | Status: DC | PRN

## 2012-12-26 MED ORDER — MAGNESIUM SULFATE 40 G IN LACTATED RINGERS - SIMPLE
2.0000 g/h | Freq: Once | INTRAVENOUS | Status: AC
  Administered 2012-12-27: 2 g/h via INTRAVENOUS
  Filled 2012-12-26: qty 500

## 2012-12-26 MED ORDER — ONDANSETRON HCL 4 MG PO TABS: 4.0000 mg | ORAL_TABLET | ORAL | Status: DC | PRN

## 2012-12-26 MED ORDER — BISACODYL 10 MG RE SUPP: 10.0000 mg | Freq: Every day | RECTAL | Status: DC | PRN

## 2012-12-26 MED ORDER — ONDANSETRON HCL 4 MG/2ML IJ SOLN: 4.0000 mg | Freq: Four times a day (QID) | INTRAMUSCULAR | Status: DC | PRN

## 2012-12-26 MED ORDER — WITCH HAZEL-GLYCERIN EX PADS: 1.0000 "application " | MEDICATED_PAD | CUTANEOUS | Status: DC | PRN

## 2012-12-26 MED ORDER — FENTANYL 2.5 MCG/ML BUPIVACAINE 1/10 % EPIDURAL INFUSION (WH - ANES): 14.0000 mL/h | INTRAMUSCULAR | Status: DC | PRN

## 2012-12-26 MED ORDER — CITRIC ACID-SODIUM CITRATE 334-500 MG/5ML PO SOLN
30.0000 mL | ORAL | Status: DC | PRN
  Filled 2012-12-26: qty 15

## 2012-12-26 MED ORDER — EPHEDRINE 5 MG/ML INJ
10.0000 mg | INTRAVENOUS | Status: DC | PRN
  Filled 2012-12-26: qty 2

## 2012-12-26 MED ORDER — LABETALOL HCL 5 MG/ML IV SOLN: 10.0000 mg | Freq: Once | INTRAVENOUS | Status: DC

## 2012-12-26 MED ORDER — MAGNESIUM SULFATE 40 MG/ML IJ SOLN
4.0000 g | Freq: Once | INTRAMUSCULAR | Status: DC
Start: 2012-12-26 — End: 2012-12-26

## 2012-12-26 MED ORDER — PRENATAL MULTIVITAMIN CH
1.0000 | ORAL_TABLET | Freq: Every day | ORAL | Status: DC
  Administered 2012-12-26 – 2012-12-28 (×3): 1 via ORAL
  Filled 2012-12-26 (×3): qty 1

## 2012-12-26 MED ORDER — LACTATED RINGERS IV SOLN
INTRAVENOUS | Status: DC
  Administered 2012-12-26 – 2012-12-27 (×3): via INTRAVENOUS

## 2012-12-26 MED ORDER — DIPHENHYDRAMINE HCL 50 MG/ML IJ SOLN: 12.5000 mg | INTRAMUSCULAR | Status: DC | PRN

## 2012-12-26 MED ORDER — TERBUTALINE SULFATE 1 MG/ML IJ SOLN
INTRAMUSCULAR | Status: AC
  Filled 2012-12-26: qty 1

## 2012-12-26 MED ORDER — MAGNESIUM SULFATE 40 G IN LACTATED RINGERS - SIMPLE
2.0000 g/h | INTRAVENOUS | Status: DC
  Filled 2012-12-26 (×2): qty 500

## 2012-12-26 MED ORDER — MEASLES, MUMPS & RUBELLA VAC ~~LOC~~ INJ: 0.5000 mL | INJECTION | Freq: Once | SUBCUTANEOUS | Status: DC

## 2012-12-26 MED ORDER — MEDROXYPROGESTERONE ACETATE 150 MG/ML IM SUSP: 150.0000 mg | INTRAMUSCULAR | Status: DC | PRN

## 2012-12-26 MED ORDER — FENTANYL CITRATE 0.05 MG/ML IJ SOLN
100.0000 ug | INTRAMUSCULAR | Status: DC | PRN
  Administered 2012-12-26: 100 ug via INTRAVENOUS
  Filled 2012-12-26 (×2): qty 2

## 2012-12-26 MED ORDER — LACTATED RINGERS IV SOLN
INTRAVENOUS | Status: DC
  Administered 2012-12-26: 10:00:00 via INTRAUTERINE

## 2012-12-26 MED ORDER — OXYTOCIN 40 UNITS IN LACTATED RINGERS INFUSION - SIMPLE MED
1.0000 m[IU]/min | INTRAVENOUS | Status: DC
  Administered 2012-12-26: 1 m[IU]/min via INTRAVENOUS
  Filled 2012-12-26: qty 1000

## 2012-12-26 MED ORDER — DIPHENHYDRAMINE HCL 25 MG PO CAPS: 25.0000 mg | ORAL_CAPSULE | Freq: Four times a day (QID) | ORAL | Status: DC | PRN

## 2012-12-26 MED ORDER — DIBUCAINE 1 % RE OINT: 1.0000 "application " | TOPICAL_OINTMENT | RECTAL | Status: DC | PRN

## 2012-12-26 MED ORDER — LACTATED RINGERS IV SOLN: 500.0000 mL | Freq: Once | INTRAVENOUS | Status: DC

## 2012-12-26 MED ORDER — FLEET ENEMA 7-19 GM/118ML RE ENEM: 1.0000 | ENEMA | Freq: Every day | RECTAL | Status: DC | PRN

## 2012-12-26 MED ORDER — TERBUTALINE SULFATE 1 MG/ML IJ SOLN: 0.2500 mg | Freq: Once | INTRAMUSCULAR | Status: AC | PRN

## 2012-12-26 MED ORDER — ZOLPIDEM TARTRATE 5 MG PO TABS: 5.0000 mg | ORAL_TABLET | Freq: Every evening | ORAL | Status: DC | PRN

## 2012-12-26 MED ORDER — LIDOCAINE HCL (PF) 1 % IJ SOLN
30.0000 mL | INTRAMUSCULAR | Status: DC | PRN
  Filled 2012-12-26 (×2): qty 30

## 2012-12-26 MED ORDER — LABETALOL HCL 5 MG/ML IV SOLN: 10.0000 mg | INTRAVENOUS | Status: DC | PRN

## 2012-12-26 MED ORDER — OXYCODONE-ACETAMINOPHEN 5-325 MG PO TABS: 1.0000 | ORAL_TABLET | ORAL | Status: DC | PRN

## 2012-12-26 MED ORDER — ONDANSETRON HCL 4 MG/2ML IJ SOLN: 4.0000 mg | INTRAMUSCULAR | Status: DC | PRN

## 2012-12-26 MED ORDER — OXYTOCIN 40 UNITS IN LACTATED RINGERS INFUSION - SIMPLE MED
1.0000 m[IU]/min | INTRAVENOUS | Status: DC
Start: 2012-12-26 — End: 2012-12-26

## 2012-12-26 MED ORDER — IBUPROFEN 600 MG PO TABS: 600.0000 mg | ORAL_TABLET | Freq: Four times a day (QID) | ORAL | Status: DC | PRN

## 2012-12-26 MED ORDER — MISOPROSTOL 25 MCG QUARTER TABLET
25.0000 ug | ORAL_TABLET | ORAL | Status: DC | PRN
  Administered 2012-12-26: 25 ug via VAGINAL
  Filled 2012-12-26: qty 1
  Filled 2012-12-26: qty 0.25

## 2012-12-26 MED ORDER — TETANUS-DIPHTH-ACELL PERTUSSIS 5-2.5-18.5 LF-MCG/0.5 IM SUSP
0.5000 mL | Freq: Once | INTRAMUSCULAR | Status: DC
  Filled 2012-12-26: qty 0.5

## 2012-12-26 NOTE — Progress Notes (Signed)
Subjective: Placed cytotec at 0045.  Intermittent lates noted w/ ctxs and prolonged decel noted at 0140 w/ min to 90s for about 8 min before returning to baseline in 140s w/ interventions.  Pt w/o c/o's.   Objective: BP 156/73  Pulse 69  Temp(Src) 98 F (36.7 C) (Oral)  Resp 18  Ht 5\' 5"  (1.651 m)  Wt 353 lb (160.12 kg)  BMI 58.74 kg/m2  LMP 02/28/2012      FHT:  FHR: 140 bpm, variability: moderate,  accelerations:  Present,  decelerations:  Present see HPI above UC:   Irregular w/ some UI SVE:   Dilation: 1.5 Effacement (%): 50 Station: -2 Exam by:: Tarvares Lant, CNM Attempted to remove cytotec, but unable to find in vault Labs: Lab Results  Component Value Date   WBC 10.9* 12/26/2012   HGB 10.9* 12/26/2012   HCT 32.2* 12/26/2012   MCV 82.6 12/26/2012   PLT 246 12/26/2012    Assessment / Plan: 1. 40 weeks 2. IOL for PreEclampsia 3. decels s/p cytotec 4. GBS neg  Labor: induction in progress Preeclampsia:  labs stable Fetal Wellbeing:  Category II Pain Control:  Labor support without medications I/D:  n/a Anticipated MOD:  svd vs c/s  1. Just given  Terbutaline.  Pt has oxygen on and receiving IVFB.  Currently on Rt side. 2. CTO very closely.  Will c/w MD prn.  If further induction needed, will not do cytotec, and will try foley and/or pitocin.    Janice Anderson 12/26/2012, 1:52 AM

## 2012-12-26 NOTE — H&P (Signed)
Janice Anderson is a 37 y.o. obese black female presenting for IOL secondary to mild Preeclampsia at 40 weeks.  Denies UTI or PIH s/s.  No vb or lof.  Decreased fm over the past week.  Turned in 24 hr urine yesterday and total protein=456mg .  No recent fever or illness.  No GI or resp c/o's.   Prenatal course: Pt had early visits to [facility], w/ first trimester u/s confirming EDC.  Onset of care at [facility] at 15 weeks.  1+ Protein noted on voided specimen at [redacted]w[redacted]d NOB w/u and urine cx negative.  Pregnancy complicated by VB at 18 weeks and again around 27 weeks.  Trx'd for BV around 18week incident.  Anatomy u/s WNL around [redacted]w[redacted]d.  Pt declined aneuploidy screening.  Early gtt WNL=129.  Lapse of care between [redacted]w[redacted]d and [redacted]w[redacted]d.  At [redacted]w[redacted]d visit, S>D and u/s for EFW at [redacted]w[redacted]d showed linear growth and EFW=82%.  BP started rising after that visit, prompting 24 hr urine.  OB Hx G1=EAB '92 G2=Stillbirth '94 s/p hypothermia event; induced G3=SVD '95 at 40 weeks, M=7+7 in [place]; had epidural G4=SAB '95 at 3 weeks G5=SVD '96 at 37weeks, M=6+6 in [place]; no anesthesia G6=SAB 4/'13 G7=current .Marland Kitchen Patient Active Problem List   Diagnosis Date Noted  . Preeclampsia 12/26/2012  . NSVD (normal spontaneous vaginal delivery) 12/26/2012  . IUFD at 22 weeks after hypothermia episode 07/15/2012  . Hx kidney stones 07/15/2012  . Hx of pyelonephritis 07/15/2012  . Chlamydia 11/2011 07/15/2012  . Heart murmur 07/15/2012  . BMI 35.0-35.9,adult 07/15/2012  . DEPRESSION 08/17/2009   History OB History   Grav Para Term Preterm Abortions TAB SAB Ect Mult Living   7 3 2 1 3 1 2   2      Past Medical History  Diagnosis Date  . No pertinent past medical history   . Anemia   . Chronic kidney disease kidney stones and pyelo  . Heart murmur     SINCE BIRTH  . Abnormal Pap smear 06/07/2012  . Preterm labor 1994    STILLBIRTH  . Infection     KIDNEY OCC  . Infection 11/2011    CHLAMYDIA  . Preeclampsia 12/26/2012    24 hr urine=456  on 12/25/12   Past Surgical History  Procedure Laterality Date  . Therapeutic abortion    . Dilation and curettage of uterus    . Wisdom tooth extraction     Family History: family history includes Alcohol abuse in her father; Birth defects in her other; Diabetes in her maternal grandmother, maternal uncles, and other; and Early death in her other.  There is no history of Other. Social History:  reports that she has never smoked. She has never used smokeless tobacco. She reports that she does not drink alcohol or use illicit drugs.   Prenatal Transfer Tool  Maternal Diabetes: No Genetic Screening: Declined Maternal Ultrasounds/Referrals: Normal Fetal Ultrasounds or other Referrals:  None Maternal Substance Abuse:  No Significant Maternal Medications:  None Significant Maternal Lab Results:  Lab values include: Group B Strep negative Other Comments:  None  Review of Systems  Constitutional: Negative.   HENT: Negative.   Eyes: Negative.   Respiratory: Negative.   Cardiovascular: Negative.   Gastrointestinal: Negative.   Genitourinary: Negative.   Skin: Negative.   Neurological: Negative.     Dilation: 1.5 Effacement (%): 50 Station: -2 Exam by:: Hudson Lehmkuhl, CNM Blood pressure 156/73, pulse 69, height 5\' 5"  (1.651 m), weight 353 lb (160.12 kg),  last menstrual period 02/28/2012. Maternal Exam:  Uterine Assessment: Contraction strength is mild.  Contraction frequency is rare.   Abdomen: Patient reports no abdominal tenderness. Fetal presentation: vertex  Introitus: Normal vulva. Pelvis: adequate for delivery.   Cervix: Cervix evaluated by sterile speculum exam.     Physical Exam  Constitutional: She is oriented to person, place, and time. She appears well-developed and well-nourished. No distress.  obese  HENT:  Head: Normocephalic and atraumatic.  Eyes: Pupils are equal, round, and reactive to light.  Cardiovascular: Normal rate.   Respiratory: Effort normal.  GI:  Soft.  gravid  Musculoskeletal: She exhibits edema.  2+ pitting BLE edema  Neurological: She is alert and oriented to person, place, and time. She has normal reflexes.  No clonus  Skin: Skin is warm and dry.  Psychiatric: She has a normal mood and affect. Her behavior is normal. Judgment and thought content normal.    Prenatal labs: ABO, Rh: AB/POS/-- (10/23 1102) Antibody: NEG (10/23 1102) Rubella: 89.8 (10/23 1102) RPR: NON REAC (10/23 1102)  HBsAg: NEGATIVE (10/23 1102)  HIV: NON REACTIVE (10/23 1102)  GBS: NEGATIVE (03/14 1308)   Assessment/Plan: 1. [redacted]w[redacted]d 2. Mild preeclampsia 3. Cat I FHT 4. GBS neg 5. Unfavorable cx  1. Admit to [facility] w/ Dr. Pennie Rushing as attending 2. Routine L&D orders 3. cytotec for ripening tonight, Pitocin in AM 4. Magnesium sulfate when Pitocin started or w/ active labor 5. PIH labs on admission 6. C/w MD prn 7. Anticipate SVD   Tatem Fesler H 12/26/2012, 1:08 AM

## 2012-12-26 NOTE — Anesthesia Preprocedure Evaluation (Deleted)
Anesthesia Evaluation  Patient identified by MRN, date of birth, ID band Patient awake    Reviewed: Allergy & Precautions, H&P , NPO status , Patient's Chart, lab work & pertinent test results  Airway       Dental   Pulmonary neg pulmonary ROS,          Cardiovascular hypertension, Pt. on medications + Valvular Problems/Murmurs Rhythm:Regular     Neuro/Psych PSYCHIATRIC DISORDERS Depression negative neurological ROS     GI/Hepatic negative GI ROS, Neg liver ROS,   Endo/Other  Morbid obesity  Renal/GU      Musculoskeletal negative musculoskeletal ROS (+)   Abdominal (+) + obese,   Peds  Hematology negative hematology ROS (+)   Anesthesia Other Findings   Reproductive/Obstetrics (+) Pregnancy                          Anesthesia Physical Anesthesia Plan  ASA: III  Anesthesia Plan:    Post-op Pain Management:    Induction:   Airway Management Planned:   Additional Equipment:   Intra-op Plan:   Post-operative Plan:   Informed Consent: I have reviewed the patients History and Physical, chart, labs and discussed the procedure including the risks, benefits and alternatives for the proposed anesthesia with the patient or authorized representative who has indicated his/her understanding and acceptance.   Dental advisory given  Plan Discussed with: CRNA  Anesthesia Plan Comments:         Anesthesia Quick Evaluation

## 2012-12-26 NOTE — Progress Notes (Signed)
Patient ID: Janice Anderson, female   DOB: 01-22-1976, 37 y.o.   MRN: [id number] Janice Anderson is a 37 y.o. [id number] at 101w0d admitted for IOL for pre-eclampsia   Subjective: Breathing w ctx now, declines epidural, but requests IV meds, rcv'd 1 dose of cytotec overnight  Denies HA/N/V/RUQ pain  Objective: BP 151/71  Pulse 69  Temp(Src) 98 F (36.7 C) (Oral)  Resp 18  Ht 5\' 5"  (1.651 m)  Wt 353 lb (160.12 kg)  BMI 58.74 kg/m2  LMP 02/28/2012  Filed Vitals:   12/26/12 0840 12/26/12 0903 12/26/12 0934 12/26/12 0936  BP: 136/48 151/75  151/71  Pulse: 69 77  69  Temp:    98 F (36.7 C)  TempSrc:    Oral  Resp: 18 20 22 18   Height:      Weight:         Total I/O In: 202.3 [P.O.:75; I.V.:127.3] Out: 150 [Urine:150]  FHT:  FHR: 120 bpm, variability: moderate,  accelerations:  Present,  decelerations:  Present variables, run of late decels have resolved w IVF's and position changes UC:   regular, every 2-4 minutes SVE:   Dilation: 4 Effacement (%): 100 Station: -1 Exam by:: sLillard,CNM  AROM - sm amt blood tinged fluid IUPC placed without difficulty FSE placed   Assessment / Plan: Induction of labor due to preeclampsia,  progressing well on pitocin  Labor: Progressing normally Preeclampsia:  on magnesium sulfate, no signs or symptoms of toxicity, intake and ouput balanced and labs stable, except uric acid elevated, will redraw labs if pt requests epidural, or PRN Fetal Wellbeing:  Category II, early variables now, will begin amnioinfusion  Pain Control:  Fentanyl Anticipated MOD:  NSVD  Recheck after 2 hours of adequate labor or PRN   Update physician PRN   Malissa Hippo 12/26/2012, 9:43 AM

## 2012-12-27 LAB — CBC
Platelets: 210 10*3/uL (ref 150–400)
RBC: 3.51 MIL/uL — ABNORMAL LOW (ref 3.87–5.11)
RDW: 15.6 % — ABNORMAL HIGH (ref 11.5–15.5)
WBC: 12.8 10*3/uL — ABNORMAL HIGH (ref 4.0–10.5)

## 2012-12-27 LAB — COMPREHENSIVE METABOLIC PANEL
ALT: 16 U/L (ref 0–35)
AST: 32 U/L (ref 0–37)
Albumin: 2.3 g/dL — ABNORMAL LOW (ref 3.5–5.2)
Alkaline Phosphatase: 102 U/L (ref 39–117)
CO2: 24 mEq/L (ref 19–32)
Chloride: 102 mEq/L (ref 96–112)
Potassium: 3.9 mEq/L (ref 3.5–5.1)
Total Bilirubin: 0.2 mg/dL — ABNORMAL LOW (ref 0.3–1.2)

## 2012-12-27 MED ORDER — SODIUM CHLORIDE 0.9 % IJ SOLN
3.0000 mL | Freq: Two times a day (BID) | INTRAMUSCULAR | Status: DC
  Administered 2012-12-27: 3 mL via INTRAVENOUS

## 2012-12-27 MED ORDER — MAGNESIUM SULFATE 40 G IN LACTATED RINGERS - SIMPLE: 2.0000 g/h | Freq: Once | INTRAVENOUS | Status: DC

## 2012-12-27 MED ORDER — SODIUM CHLORIDE 0.9 % IJ SOLN: 3.0000 mL | INTRAMUSCULAR | Status: DC | PRN

## 2012-12-27 NOTE — Progress Notes (Signed)
UR chart review completed.  

## 2012-12-27 NOTE — Progress Notes (Signed)
Patient was referred for history of depression/anxiety. * Referral screened out by Clinical Social Worker because none of the following criteria appear to apply: ~ History of anxiety/depression during this pregnancy, or of post-partum depression. ~ Diagnosis of anxiety and/or depression within last 3 years ~ History of depression due to pregnancy loss/loss of child OR * Patient's symptoms currently being treated with medication and/or therapy. Please contact the Clinical Social Worker if needs arise, or if patient requests.  CSW contacted by CN RN stating that hx of Depression is listed under "non-hospital problem list" under Nursing Adm Summary.  CSW notes that this dx was 4 years ago. 

## 2012-12-27 NOTE — Progress Notes (Signed)
Pt transferred ambulatory to [facility] Rm #[id number] report updated to Ermelinda Das, RN

## 2012-12-27 NOTE — Progress Notes (Signed)
Post Partum Day 1  Subjective: no complaints and No headaches, blurred vision, or RUQ pain.  Objective: Blood pressure 151/77, pulse 77, temperature 97.8 F (36.6 C), temperature source Oral, resp. rate 16, height 5\' 5"  (1.651 m), weight 248 lb 6.4 oz (112.674 kg), last menstrual period 02/28/2012, SpO2 98.00%, unknown if currently breastfeeding.  Physical Exam:  General: no distress Lochia: appropriate Uterine Fundus: firm Incision: NA DVT Evaluation: No evidence of DVT seen on physical exam. Reflexes: Normal, No clonus  CMP     Component Value Date/Time   NA 135 12/27/2012 0550   K 3.9 12/27/2012 0550   CL 102 12/27/2012 0550   CO2 24 12/27/2012 0550   GLUCOSE 102* 12/27/2012 0550   BUN 7 12/27/2012 0550   CREATININE 1.03 12/27/2012 0550   CALCIUM 8.2* 12/27/2012 0550   PROT 5.5* 12/27/2012 0550   ALBUMIN 2.3* 12/27/2012 0550   AST 32 12/27/2012 0550   ALT 16 12/27/2012 0550   ALKPHOS 102 12/27/2012 0550   BILITOT 0.2* 12/27/2012 0550   GFRNONAA 69* 12/27/2012 0550   GFRAA 79* 12/27/2012 0550    CBC    Component Value Date/Time   WBC 12.8* 12/27/2012 0550   RBC 3.51* 12/27/2012 0550   HGB 9.7* 12/27/2012 0550   HCT 28.8* 12/27/2012 0550   PLT 210 12/27/2012 0550   MCV 82.1 12/27/2012 0550   MCH 27.6 12/27/2012 0550   MCHC 33.7 12/27/2012 0550   RDW 15.6* 12/27/2012 0550   LYMPHSABS 1.7 07/07/2012 1102   MONOABS 0.7 07/07/2012 1102   EOSABS 0.1 07/07/2012 1102   BASOSABS 0.0 07/07/2012 1102       Recent Labs  12/26/12 0100 12/27/12 0550  HGB 10.9* 9.7*  HCT 32.2* 28.8*    Assessment/Plan: Plan for discharge tomorrow, Breastfeeding and Contraception : Undecided Improved preeclampsia. Transfer to [facility] unit.    LOS: 1 day   Gordan Grell V 12/27/2012, 11:00 AM

## 2012-12-27 NOTE — Progress Notes (Signed)
CSW received consult for hx of Depression, but does not see this documented anywhere in MOB's PNR.  CSW called two RNs who have cared for MOB today.  Both stated that they have not seen depressive symptoms exhibited during hospitalization and also did not see hx of Depression in record.  CSW asked current bedside RN to contact CSW if any concerns arise or if MOB requests to speak to a CSW, but at this time is screening out the referral. 

## 2012-12-28 LAB — CBC WITH DIFFERENTIAL/PLATELET
Basophils Absolute: 0 10*3/uL (ref 0.0–0.1)
Eosinophils Relative: 1 % (ref 0–5)
Lymphocytes Relative: 17 % (ref 12–46)
MCV: 82.5 fL (ref 78.0–100.0)
Neutrophils Relative %: 72 % (ref 43–77)
Platelets: 229 10*3/uL (ref 150–400)
RDW: 15.7 % — ABNORMAL HIGH (ref 11.5–15.5)
WBC: 10.6 10*3/uL — ABNORMAL HIGH (ref 4.0–10.5)

## 2012-12-28 LAB — COMPREHENSIVE METABOLIC PANEL
Albumin: 2.4 g/dL — ABNORMAL LOW (ref 3.5–5.2)
Alkaline Phosphatase: 94 U/L (ref 39–117)
BUN: 9 mg/dL (ref 6–23)
CO2: 27 mEq/L (ref 19–32)
Chloride: 103 mEq/L (ref 96–112)
GFR calc Af Amer: >90 mL/min (ref >90)
GFR calc non Af Amer: 85 mL/min — ABNORMAL LOW (ref >90)
Glucose, Bld: 80 mg/dL (ref 70–99)
Potassium: 3.9 mEq/L (ref 3.5–5.1)
Total Bilirubin: 0.2 mg/dL — ABNORMAL LOW (ref 0.3–1.2)

## 2012-12-28 LAB — URIC ACID: Uric Acid, Serum: 8 mg/dL — ABNORMAL HIGH (ref 2.4–7.0)

## 2012-12-28 MED ORDER — NIFEDIPINE ER OSMOTIC RELEASE 30 MG PO TB24: 30.0000 mg | ORAL_TABLET | Freq: Every day | ORAL | Status: DC

## 2012-12-28 MED ORDER — IBUPROFEN 600 MG PO TABS: 600.0000 mg | ORAL_TABLET | Freq: Four times a day (QID) | ORAL | Status: DC

## 2012-12-28 MED ORDER — FERROUS SULFATE 325 (65 FE) MG PO TABS: 325.0000 mg | ORAL_TABLET | Freq: Two times a day (BID) | ORAL | Status: DC

## 2012-12-28 NOTE — Discharge Summary (Signed)
  Vaginal Delivery Discharge Summary  Janice Anderson  DOB:    11-24-1975 MRN:    [id number] CSN:    [id number]  Date of admission:                  12/26/12  Date of discharge:                   12/28/12  Procedures this admission: SVD with repair of 1st degree lac; IOL for Pre-Eclampsia  Date of Delivery: 12/26/12 by Dr. Awilda Metro  Newborn Data:  Live born female  Birth Weight: 8 lb (3629 g) APGAR: 7, 8  Home with mother. Circumcision Plan: Outpatient  History of Present Illness:  Janice Anderson is a 37 y.o. female, [phone], who presents at [redacted]w[redacted]d weeks gestation. The patient has been followed at the [facility] and Gynecology division of [facility]. She was admitted induction of labor. Her pregnancy has been complicated by:   Patient Active Problem List  Diagnosis  . DEPRESSION  . IUFD at 22 weeks after hypothermia episode  . Hx kidney stones  . Hx of pyelonephritis  . Chlamydia 11/2011  . Heart murmur  . BMI 35.0-35.9,adult  . Preeclampsia  . NSVD (normal spontaneous vaginal delivery)    Hospital course:  The patient was admitted for IOL for Pre-Eclampsia.   Her labor was complicated - 24 hour Mag Sulfate . She proceeded to have a vaginal delivery of a healthy infant. Her delivery was not complicated. Her postpartum course was not complicated. She was discharged to home on postpartum day 2 doing well.  Filed Vitals:   12/27/12 2230 12/28/12 0252 12/28/12 0626 12/28/12 0910  BP: 154/73 122/86 147/83 132/70  Pulse: 81 71 75 88  Temp: 98.5 F (36.9 C) 97.9 F (36.6 C) 98.5 F (36.9 C)   TempSrc: Oral Oral Oral   Resp: 18 18 18 16   Height:      Weight:   248 lb 12.8 oz (112.855 kg)   SpO2:        Feeding:  breast  Contraception:  condoms, oral contraceptives (estrogen/progesterone)  Discharge hemoglobin:  Hemoglobin  Date Value Range Status  12/28/2012 9.6* 12.0 - 15.0 g/dL Final     HCT   Date Value Range Status  12/28/2012 28.8* 36.0 - 46.0 % Final    Discharge Physical Exam:   General: alert and no distress Lochia: appropriate Uterine Fundus: firm Incision: healing well DVT Evaluation: No evidence of DVT seen on physical exam.  No headaches, blurred vision, or RUQ pain  Intrapartum Procedures: spontaneous vaginal delivery and 24 hour Mag Sulfate Postpartum Procedures: none Complications-Operative and Postpartum: 1st degree perineal laceration  Discharge Diagnoses: Term Pregnancy-delivered and Preelampsia  Discharge Information:  Activity:           per [facility] handbook Diet:                routine Medications: Ibuprofen, Iron and Procardia 30 mg QD Condition:      stable and improved Instructions:  refer to practice specific booklet Discharge to: home  Follow-up Information   Follow up with [facility]. Schedule an appointment as soon as possible for a visit in 1 week. (Call with any questions or concerns)    Contact information:   [address] [place] [address] [phone]       Haroldine Laws 12/28/2012

## 2014-07-17 ENCOUNTER — Encounter (HOSPITAL_COMMUNITY): Payer: Self-pay

## 2016-10-16 ENCOUNTER — Emergency Department (HOSPITAL_COMMUNITY)
Admission: EM | Admit: 2016-10-16 | Discharge: 2016-10-16 | Disposition: A | Discharge status: Home / Self Care | Payer: Medicaid Other | Attending: Emergency Medicine | Admitting: Emergency Medicine

## 2016-10-16 ENCOUNTER — Encounter (HOSPITAL_COMMUNITY): Payer: Self-pay | Admitting: Emergency Medicine

## 2016-10-16 ENCOUNTER — Emergency Department (HOSPITAL_COMMUNITY): Payer: Medicaid Other

## 2016-10-16 DIAGNOSIS — S8001XA Contusion of right knee, initial encounter: Secondary | ICD-10-CM

## 2016-10-16 DIAGNOSIS — S0181XA Laceration without foreign body of other part of head, initial encounter: Secondary | ICD-10-CM

## 2016-10-16 DIAGNOSIS — S060X1A Concussion with loss of consciousness of 30 minutes or less, initial encounter: Secondary | ICD-10-CM | POA: Insufficient documentation

## 2016-10-16 DIAGNOSIS — S0993XA Unspecified injury of face, initial encounter: Secondary | ICD-10-CM

## 2016-10-16 DIAGNOSIS — Y9384 Activity, sleeping: Secondary | ICD-10-CM | POA: Diagnosis not present

## 2016-10-16 DIAGNOSIS — Y92410 Unspecified street and highway as the place of occurrence of the external cause: Secondary | ICD-10-CM | POA: Diagnosis not present

## 2016-10-16 DIAGNOSIS — S022XXA Fracture of nasal bones, initial encounter for closed fracture: Secondary | ICD-10-CM

## 2016-10-16 DIAGNOSIS — S0990XA Unspecified injury of head, initial encounter: Secondary | ICD-10-CM | POA: Diagnosis present

## 2016-10-16 DIAGNOSIS — S01411A Laceration without foreign body of right cheek and temporomandibular area, initial encounter: Secondary | ICD-10-CM | POA: Diagnosis not present

## 2016-10-16 DIAGNOSIS — Y999 Unspecified external cause status: Secondary | ICD-10-CM | POA: Diagnosis not present

## 2016-10-16 MED ORDER — IBUPROFEN 200 MG PO TABS
400.0000 mg | ORAL_TABLET | Freq: Once | ORAL | Status: AC
  Administered 2016-10-16: 400 mg via ORAL
  Filled 2016-10-16: qty 2

## 2016-10-16 MED ORDER — HYDROCODONE-ACETAMINOPHEN 5-325 MG PO TABS
1.0000 | ORAL_TABLET | Freq: Once | ORAL | Status: AC
  Administered 2016-10-16: 1 via ORAL
  Filled 2016-10-16: qty 1

## 2016-10-16 MED ORDER — AMOXICILLIN 500 MG PO CAPS
500.0000 mg | ORAL_CAPSULE | Freq: Once | ORAL | Status: AC
  Administered 2016-10-16: 500 mg via ORAL
  Filled 2016-10-16: qty 1

## 2016-10-16 MED ORDER — SODIUM CHLORIDE 0.9 % IV SOLN
3.0000 g | Freq: Once | INTRAVENOUS | Status: DC
  Filled 2016-10-16: qty 3

## 2016-10-16 MED ORDER — CHLORHEXIDINE GLUCONATE 0.12 % MT SOLN: OROMUCOSAL | 0 refills | Status: DC

## 2016-10-16 MED ORDER — LIDOCAINE HCL 2 % IJ SOLN
20.0000 mL | Freq: Once | INTRAMUSCULAR | Status: AC
  Administered 2016-10-16: 400 mg
  Filled 2016-10-16: qty 20

## 2016-10-16 MED ORDER — HYDROCODONE-ACETAMINOPHEN 5-325 MG PO TABS: 1.0000 | ORAL_TABLET | ORAL | 0 refills | Status: DC | PRN

## 2016-10-16 MED ORDER — AMOXICILLIN 500 MG PO CAPS: 500.0000 mg | ORAL_CAPSULE | Freq: Three times a day (TID) | ORAL | 0 refills | Status: DC
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
## 2016-10-16 NOTE — Discharge Instructions (Addendum)
Swish and spit mouth rinse after any meal or by mouth intake. Non-chew soft diet. No straws Suture removal in 7-10 days.
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
## 2016-10-16 NOTE — ED Provider Notes (Signed)
Patient discussed with Dr. Preston FleetingGlick. Patient examined. Laceration repaired. We'll plan discharge home with amoxicillin, Vicodin, non-chew diet, Peridex mouth rinse.  Laceration of interests runs longitudinally from just inferior to the nasal [place] and just inferior to the lateral nasal, and on to the left upper lip. It does not violate the vermilion border. Her dentition is intact. This is a through and through laceration with mucosal disruption. After some cutaneous suturing and skin approximation mucosal laceration is self approximating therefore no intraoral sutures were placed.  LACERATION REPAIR Performed by: Claudean KindsJAMES, Dayvin Aber JOSEPH Authorized by: Claudean KindsJAMES, Karlyn Glasco JOSEPH Consent: Verbal consent obtained. Risks and benefits: risks, benefits and alternatives were discussed Consent given by: patient Patient identity confirmed: provided demographic data Prepped and Draped in normal sterile fashion Wound explored  Laceration Location:  Left maxilla  Laceration Length: 5cm  No Foreign Bodies seen or palpated  Anesthesia: local infiltration  Local anesthetic: lidocaine 1% c epinephrine  Anesthetic total: 4 ml  Irrigation method: syringe Amount of cleaning: standard  Skin closure: 4-0 Nylon, 5-0 vicryl  Number of sutures: 9 2 subcutaneous, and 7 skin  Technique: layered  Patient tolerance: Patient tolerated the procedure well with no immediate complications.    Rolland PorterMark Serapio Edelson, MD 10/16/16 [id number]

## 2016-10-16 NOTE — ED Provider Notes (Signed)
LACERATION REPAIR Performed by: Chase PicketJaime Pilcher Aren Pryde Authorized by: Chase PicketJaime Pilcher Rajat Staver Consent: Verbal consent obtained. Risks and benefits: risks, benefits and alternatives were discussed Consent given by: patient Patient identity confirmed: provided demographic data Prepped and Draped in normal sterile fashion Wound explored Laceration Location: Left eyebrow Laceration Length: 5cm No Foreign Bodies seen or palpated Anesthesia: local infiltration Local anesthetic: lidocaine 2% Anesthetic total: 4 ml Irrigation method: syringe Amount of cleaning: standard Skin closure: 5-0 Number of sutures: 8 Technique: Simple interrupted Patient tolerance: Patient tolerated the procedure well with no immediate complications.   [facility] CenterJaime Pilcher Raygan Skarda, PA-C 10/16/16 [phone]    Rolland PorterMark James, MD 10/24/16 [phone]

## 2016-10-25 ENCOUNTER — Emergency Department (HOSPITAL_COMMUNITY)
Admission: EM | Admit: 2016-10-25 | Discharge: 2016-10-25 | Disposition: A | Discharge status: Home / Self Care | Payer: Medicaid Other | Attending: Emergency Medicine | Admitting: Emergency Medicine

## 2016-10-25 ENCOUNTER — Encounter (HOSPITAL_COMMUNITY): Payer: Self-pay | Admitting: Emergency Medicine

## 2016-10-25 DIAGNOSIS — N189 Chronic kidney disease, unspecified: Secondary | ICD-10-CM | POA: Diagnosis not present

## 2016-10-25 DIAGNOSIS — Z4802 Encounter for removal of sutures: Secondary | ICD-10-CM | POA: Diagnosis present

## 2016-10-25 MED ORDER — BACITRACIN ZINC 500 UNIT/GM EX OINT
TOPICAL_OINTMENT | CUTANEOUS | Status: AC
  Filled 2016-10-25: qty 0.9

## 2016-10-25 NOTE — ED Notes (Signed)
Pt has sutures to L side of face just above upper lip. No redness, swelling or pus drainage noted. Pt states the area itches. Denies pain.

## 2016-10-25 NOTE — ED Triage Notes (Signed)
Pt here for suture removal on face. Has had sutures in place for 9 days. No complications
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
## 2016-10-25 NOTE — Discharge Instructions (Signed)
As we discussed please return t the emergency department if experiencing any signs of infection at the site including redness, swelling, warmth or purulent discharge.  Status care with a primary care provider from the list provided and follow-up as needed.  Keep the area clean and dry

## 2018-08-21 ENCOUNTER — Encounter (HOSPITAL_COMMUNITY): Payer: Self-pay | Admitting: Emergency Medicine

## 2018-08-21 ENCOUNTER — Emergency Department (HOSPITAL_COMMUNITY)
Admission: EM | Admit: 2018-08-21 | Discharge: 2018-08-21 | Disposition: A | Discharge status: Home / Self Care | Payer: Self-pay | Attending: Emergency Medicine | Admitting: Emergency Medicine

## 2018-08-21 ENCOUNTER — Emergency Department (HOSPITAL_COMMUNITY): Payer: Self-pay

## 2018-08-21 DIAGNOSIS — Y929 Unspecified place or not applicable: Secondary | ICD-10-CM | POA: Insufficient documentation

## 2018-08-21 DIAGNOSIS — S0990XA Unspecified injury of head, initial encounter: Secondary | ICD-10-CM

## 2018-08-21 DIAGNOSIS — Y999 Unspecified external cause status: Secondary | ICD-10-CM | POA: Insufficient documentation

## 2018-08-21 DIAGNOSIS — Y9351 Activity, roller skating (inline) and skateboarding: Secondary | ICD-10-CM | POA: Insufficient documentation

## 2018-08-21 DIAGNOSIS — S060X9A Concussion with loss of consciousness of unspecified duration, initial encounter: Secondary | ICD-10-CM | POA: Insufficient documentation

## 2018-08-21 LAB — I-STAT BETA HCG BLOOD, ED (MC, WL, AP ONLY): I-stat hCG, quantitative: <5 m[IU]/mL (ref <5)

## 2018-08-21 MED ORDER — ACETAMINOPHEN 500 MG PO TABS
1000.0000 mg | ORAL_TABLET | Freq: Once | ORAL | Status: AC
  Administered 2018-08-21: 1000 mg via ORAL
  Filled 2018-08-21: qty 2

## 2018-08-21 MED ORDER — ONDANSETRON 4 MG PO TBDP
8.0000 mg | ORAL_TABLET | Freq: Once | ORAL | Status: AC
  Administered 2018-08-21: 8 mg via ORAL
  Filled 2018-08-21: qty 2

## 2018-08-21 NOTE — Progress Notes (Signed)
Chaplain responded to Level 2 trauma in Trauma A.  42 year old woman skating with family fell and hit her head. She was alert, but slightly disoriented.  Chaplain brought family to bedside and provided ministry of presence.  Will be available. Lynnell ChadVirginia Naoko Diperna Pager [phone]

## 2018-08-21 NOTE — ED Notes (Signed)
Pt getting dressed now.  

## 2018-08-21 NOTE — ED Notes (Signed)
CT scan tech notified that pt. is stable/ready to go for her CT scan ordered by EDP.

## 2018-08-21 NOTE — ED Provider Notes (Signed)
[facility] EMERGENCY DEPARTMENT Provider Note   CSN: [id number] Arrival date & time: 08/21/18  2050     History   Chief Complaint Chief Complaint  Patient presents with  . Fall    Roller [place] - level 2    HPI Janice Anderson is a 42 y.o. female.  The history is provided by the patient. No language interpreter was used.  Trauma Mechanism of injury: fall Injury location: head/neck Injury location detail: head Incident location: rolling rink. Arrived directly from scene: yes   Fall:      Fall occurred: on rollerskates.      Height of fall: from standing      Impact surface: hard floor      Entrapped after fall: no  EMS/PTA data:      Bystander interventions: none      Ambulatory at scene: yes      Blood loss: none      Responsiveness: alert      Oriented to: person      Loss of consciousness: yes      Amnesic to event: yes      Airway interventions: none      Breathing interventions: none      IV access: established      IO access: none      Cardiac interventions: none      Medications administered: none      Immobilization: C-collar      Airway condition since incident: stable      Breathing condition since incident: stable      Circulation condition since incident: stable      Mental status condition since incident: improving      Disability condition since incident: stable  Current symptoms:      Associated symptoms:            Reports headache and loss of consciousness.            Denies abdominal pain, back pain, chest pain, difficulty breathing, nausea, seizures and vomiting.    History reviewed. No pertinent past medical history.  There are no active problems to display for this patient.   History reviewed. No pertinent surgical history.   OB History   None      Home Medications    Prior to Admission medications   Not on File    Family History No family history on file.  Social History Social History    Tobacco Use  . Smoking status: Never Smoker  . Smokeless tobacco: Never Used  Substance Use Topics  . Alcohol use: Never    Frequency: Never  . Drug use: Never     Allergies   Patient has no known allergies.   Review of Systems Review of Systems  Unable to perform ROS: Mental status change  Cardiovascular: Negative for chest pain.  Gastrointestinal: Negative for abdominal pain, nausea and vomiting.  Musculoskeletal: Negative for back pain.  Neurological: Positive for loss of consciousness and headaches. Negative for seizures.     Physical Exam Updated Vital Signs BP (!) 141/67   Pulse (!) 102   Temp 98 F (36.7 C) (Temporal)   Resp (!) 22   Ht 5\' 5"  (1.651 m)   Wt 99.8 kg   SpO2 100%   BMI 36.61 kg/m   Physical Exam  Constitutional: She appears well-developed and well-nourished. No distress.  HENT:  Head: Normocephalic and atraumatic.  Eyes: Conjunctivae are normal.  Neck: Neck supple.  Cardiovascular: Normal  rate and regular rhythm.  No murmur heard. Pulmonary/Chest: Effort normal and breath sounds normal. No respiratory distress.  Abdominal: Soft. There is no tenderness.  Musculoskeletal: She exhibits no edema.  Neurological: She is alert. She has normal strength. No cranial nerve deficit or sensory deficit. GCS eye subscore is 4. GCS verbal subscore is 4.  repetitive questioning, intermittently confused.   Skin: Skin is warm and dry.  Psychiatric: She has a normal mood and affect.  Nursing note and vitals reviewed.  ED Treatments / Results  Labs (all labs ordered are listed, but only abnormal results are displayed) Labs Reviewed  I-STAT BETA HCG BLOOD, ED ([facility], [facility], [facility] ONLY)    EKG None  Radiology Ct Head Wo Contrast  Result Date: 08/21/2018 CLINICAL DATA:  Per ed notes: [facility] , lost her balance while skating and fell , hit her head against the wood floor , unsure if she lost consciousnessHead trauma, minor,  GCS>=13, high clinical risk, initial exam; C-spine trauma, high clinical risk (NEXUS/CCR) EXAM: CT HEAD WITHOUT CONTRAST CT CERVICAL SPINE WITHOUT CONTRAST TECHNIQUE: Multidetector CT imaging of the head and cervical spine was performed following the standard protocol without intravenous contrast. Multiplanar CT image reconstructions of the cervical spine were also generated. COMPARISON:  None. FINDINGS: CT HEAD FINDINGS No intracranial hemorrhage. No parenchymal contusion. No midline shift or mass effect. Basilar cisterns are patent. No skull base fracture. No fluid in the paranasal sinuses or mastoid air cells. Orbits are normal. CT CERVICAL SPINE FINDINGS Alignment: Normal alignment of the cervical vertebral bodies. Skull base and vertebrae: Normal craniocervical junction. No loss of vertebral body height or disc height. Normal facet articulation. No evidence of fracture. Soft tissues and spinal canal: No prevertebral soft tissue swelling. No perispinal or epidural hematoma. Disc levels:  Unremarkable Upper chest: 10 mm nodule RIGHT lobe of thyroid gland. No further evaluation for nodules of the size per consensus criteria. Other: None IMPRESSION: 1. No intracranial trauma.  Normal head CT. 2. No cervical spine fracture. Electronically Signed   By: Genevive BiStewart  Edmunds M.D.   On: 08/21/2018 21:52   Ct Cervical Spine Wo Contrast  Result Date: 08/21/2018 CLINICAL DATA:  Per ed notes: [facility] , lost her balance while skating and fell , hit her head against the wood floor , unsure if she lost consciousnessHead trauma, minor, GCS>=13, high clinical risk, initial exam; C-spine trauma, high clinical risk (NEXUS/CCR) EXAM: CT HEAD WITHOUT CONTRAST CT CERVICAL SPINE WITHOUT CONTRAST TECHNIQUE: Multidetector CT imaging of the head and cervical spine was performed following the standard protocol without intravenous contrast. Multiplanar CT image reconstructions of the cervical spine were also  generated. COMPARISON:  None. FINDINGS: CT HEAD FINDINGS No intracranial hemorrhage. No parenchymal contusion. No midline shift or mass effect. Basilar cisterns are patent. No skull base fracture. No fluid in the paranasal sinuses or mastoid air cells. Orbits are normal. CT CERVICAL SPINE FINDINGS Alignment: Normal alignment of the cervical vertebral bodies. Skull base and vertebrae: Normal craniocervical junction. No loss of vertebral body height or disc height. Normal facet articulation. No evidence of fracture. Soft tissues and spinal canal: No prevertebral soft tissue swelling. No perispinal or epidural hematoma. Disc levels:  Unremarkable Upper chest: 10 mm nodule RIGHT lobe of thyroid gland. No further evaluation for nodules of the size per consensus criteria. Other: None IMPRESSION: 1. No intracranial trauma.  Normal head CT. 2. No cervical spine fracture. Electronically Signed   By:  Genevive Bi M.D.   On: 08/21/2018 21:52    Procedures Procedures (including critical care time)  Medications Ordered in ED Medications  acetaminophen (TYLENOL) tablet 1,000 mg (has no administration in time range)  ondansetron (ZOFRAN-ODT) disintegrating tablet 8 mg (has no administration in time range)     Initial Impression / Assessment and Plan / ED Course  I have reviewed the triage vital signs and the nursing notes.  Pertinent labs & imaging results that were available during my care of the patient were reviewed by me and considered in my medical decision making (see chart for details).    Patient is a 42 year old female with past medical history significant for obesity presenting after she hit her head while rollerskating today, repetitive questioning, GCS of 14.  Patient has no other signs of obvious trauma, vital signs are stable. Patient moving all extremities without difficulty, denies any other areas of pain except for head.  Patient will undergo CTA head and neck for depressed GCS and amnesia  to the event.  No other additional imaging needed at this time as patient has no other signs of obvious trauma. CT head and neck unremarkable. Patient will be given Tylenol and p.o. Zofran. Patient was reassessed and complaining of mild headache, cervical collar was cleared clinically as patient denies neck pain. Patient will be discharged with strict return precautions.  Concussion symptoms and management was discussed with son and patient at bedside.  All questions answered.  Patient counseled that she should get a new primary care doctor as well because she does not have any PCP follow-up.  Final Clinical Impressions(s) / ED Diagnoses   Final diagnoses:  Injury of head, initial encounter  Concussion with loss of consciousness, initial encounter    ED Discharge Orders    None       Joaquin Courts, MD 08/21/18 2216    Gwyneth Sprout, MD 08/22/18 0001

## 2018-08-21 NOTE — ED Triage Notes (Signed)
[facility] , lost her balance while skating and fell , hit her head against the wood floor , unsure if she lost consciousness , denies pain at arrival/respirations unlabored . Alert and oriented at arrival but unable to recall incident .

## 2018-08-23 ENCOUNTER — Other Ambulatory Visit: Payer: Self-pay

## 2018-08-23 ENCOUNTER — Encounter (HOSPITAL_COMMUNITY): Payer: Self-pay | Admitting: Emergency Medicine

## 2018-08-23 ENCOUNTER — Ambulatory Visit (HOSPITAL_COMMUNITY)
Admission: EM | Admit: 2018-08-23 | Discharge: 2018-08-23 | Disposition: A | Discharge status: Home / Self Care | Payer: Self-pay | Attending: Family Medicine | Admitting: Family Medicine

## 2018-08-23 ENCOUNTER — Encounter (HOSPITAL_COMMUNITY): Payer: Self-pay

## 2018-08-23 DIAGNOSIS — Z09 Encounter for follow-up examination after completed treatment for conditions other than malignant neoplasm: Secondary | ICD-10-CM

## 2018-08-23 DIAGNOSIS — S0990XD Unspecified injury of head, subsequent encounter: Secondary | ICD-10-CM

## 2018-08-23 DIAGNOSIS — S0990XA Unspecified injury of head, initial encounter: Secondary | ICD-10-CM

## 2018-08-23 DIAGNOSIS — Y9351 Activity, roller skating (inline) and skateboarding: Secondary | ICD-10-CM

## 2018-08-23 DIAGNOSIS — M5489 Other dorsalgia: Secondary | ICD-10-CM

## 2018-08-23 NOTE — Discharge Instructions (Signed)
May retunr to work Use anti-inflammatories for pain/swelling. You may take up to 800 mg Ibuprofen every 8 hours with food. You may supplement Ibuprofen with Tylenol [phone] mg every 8 hours.   Follow up if symptoms worsening, developing weakness, difficulty remembering, lightheadedness

## 2018-08-23 NOTE — ED Triage Notes (Signed)
Pt states she feel fine other then some back pain. Pt would like to be cleared she she can go back to work.
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
## 2019-02-11 IMAGING — CT CT CERVICAL SPINE W/O CM
5 of 8 series · 12 of 33 positions shown, 13 images · non-contrast
Comparison: None.

CLINICAL DATA: Per ed notes: Patient arrived with EMS from
[place] , lost her balance while skating and fell , hit her head
against the [place] floor , unsure if she lost consciousnessHead
trauma, minor, GCS>=13, high clinical risk, initial exam; C-spine
trauma, high clinical risk (NEXUS/CCR)

EXAM:
CT HEAD WITHOUT CONTRAST
CT CERVICAL SPINE WITHOUT CONTRAST
TECHNIQUE: Multidetector CT imaging of the head and cervical spine was
performed following the standard protocol without intravenous
contrast. Multiplanar CT image reconstructions of the cervical spine
were also generated.

[Series 5: head 2.0 h70h · axial · 0.41mm/px · z∈[-80,-22]mm · 2 of 87 slices shown]
[im 29/87  bone]
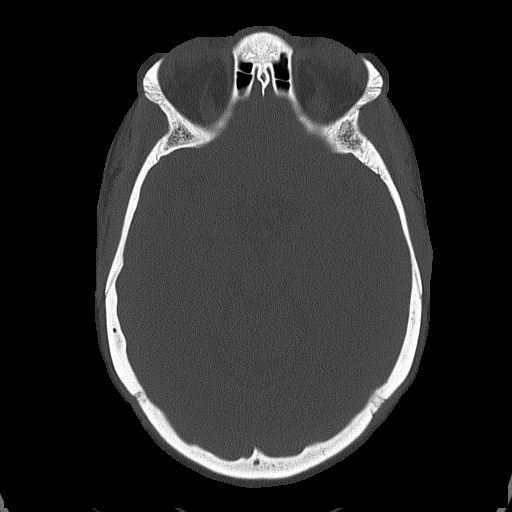
[im 58/87  bone]
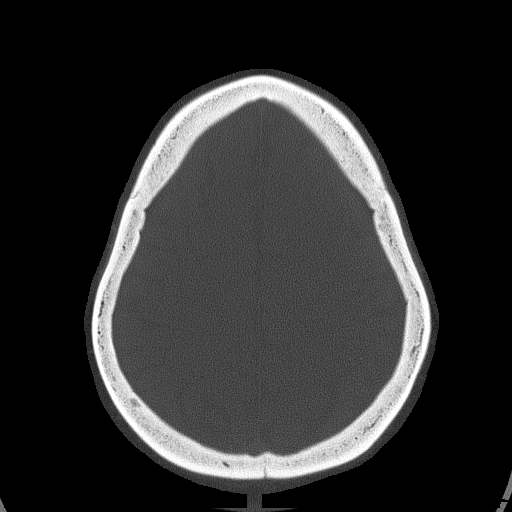

[Series 9: c_spine 2.0 st · axial · 0.38mm/px · z∈[-248,-148]mm · 3 of 101 slices shown, 4 images]
[im 26/101  soft-tissue]
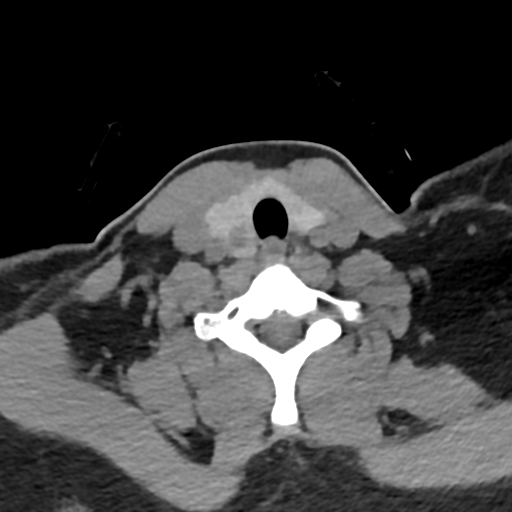
[im 26/101  bone]
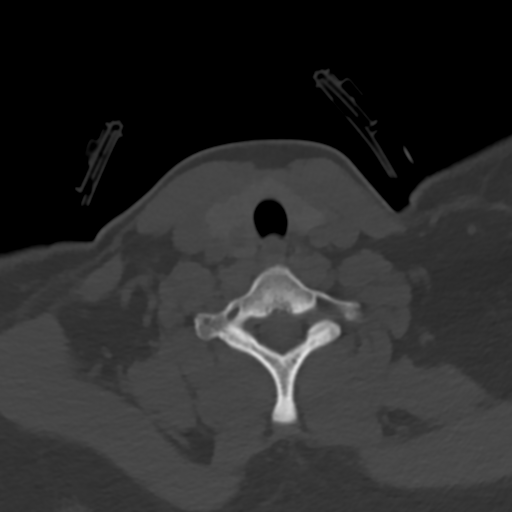
[im 51/101  bone]
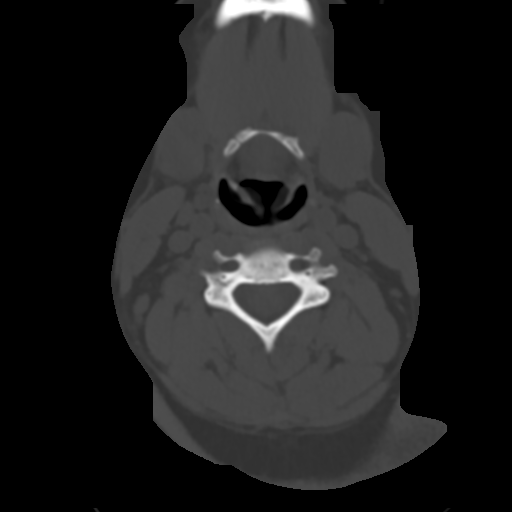
[im 76/101  bone]
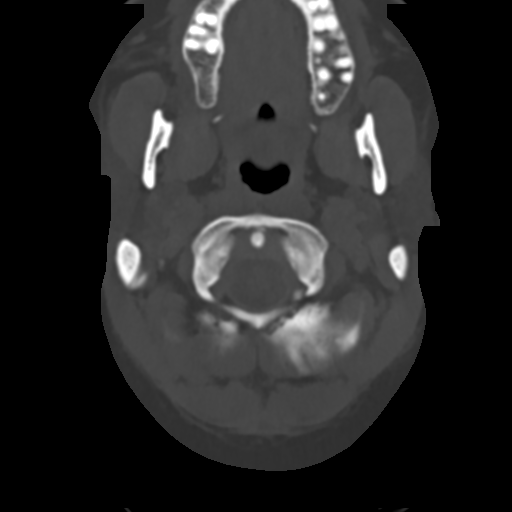

[Series 10: coronal bone · coronal · 0.23mm/px · 1 of 61 slices shown]
[im 31/61  bone]
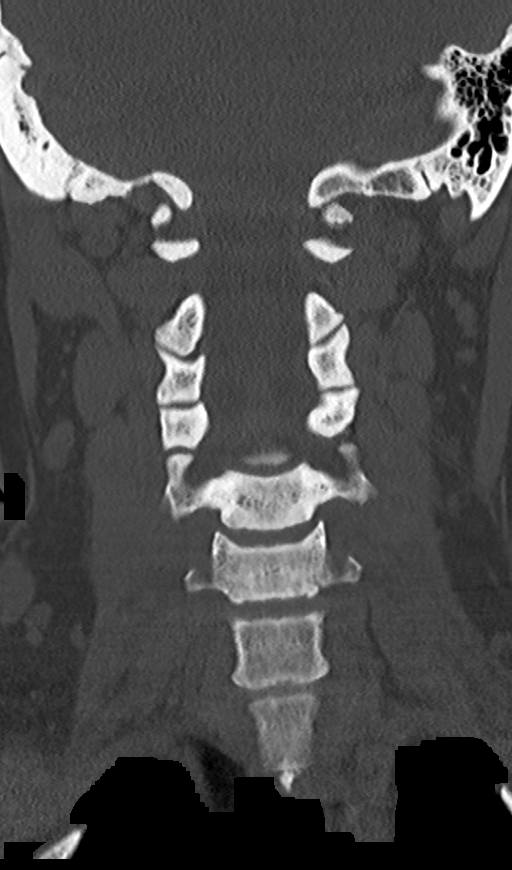

[Series 11: sagittal bone · sagittal · 0.29mm/px · 4 of 61 slices shown]
[im 13/61  bone]
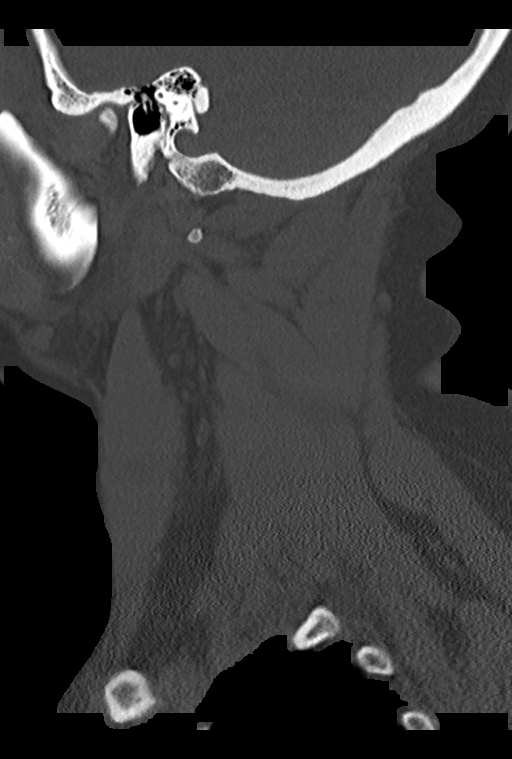
[im 25/61  bone]
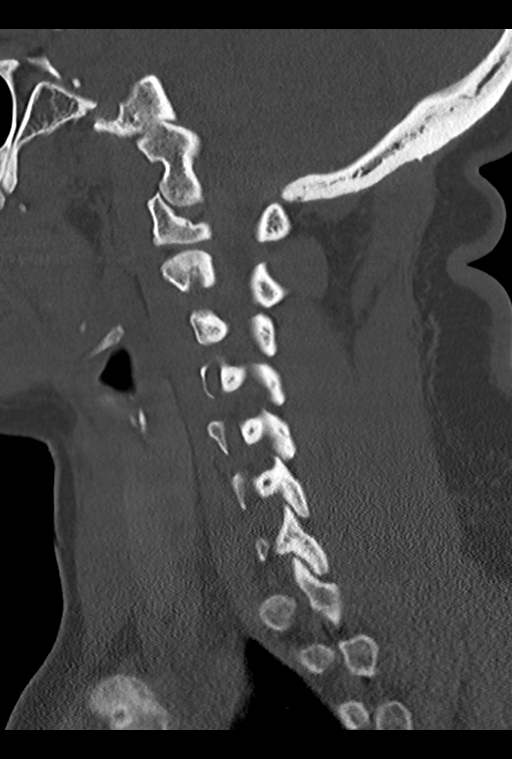
[im 37/61  bone]
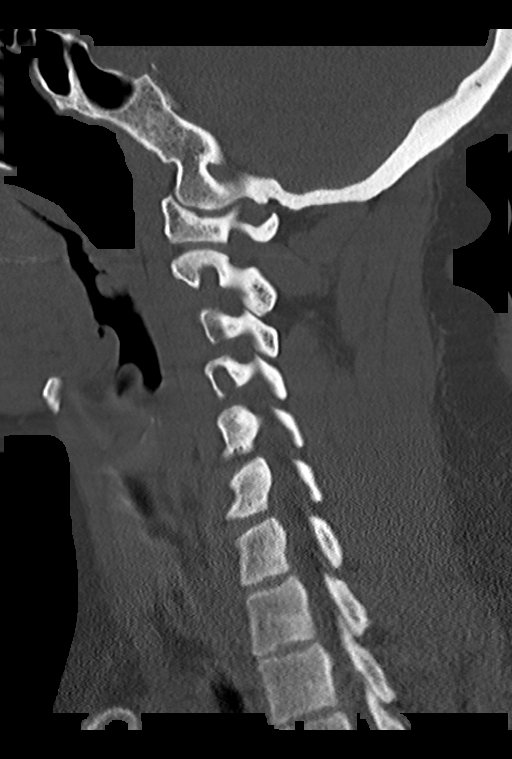
[im 49/61  bone]
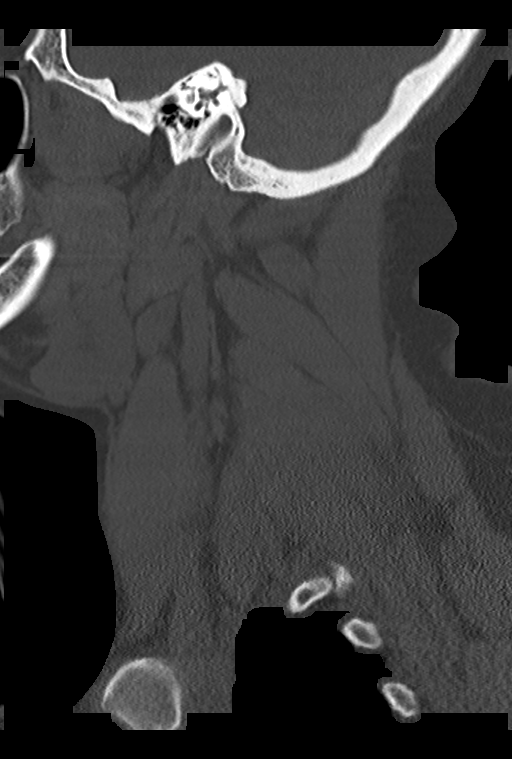

[Series 13: orthogonal axial st · axial · 0.21mm/px · z∈[-253,-198]mm · 2 of 89 slices shown]
[im 30/89  bone]
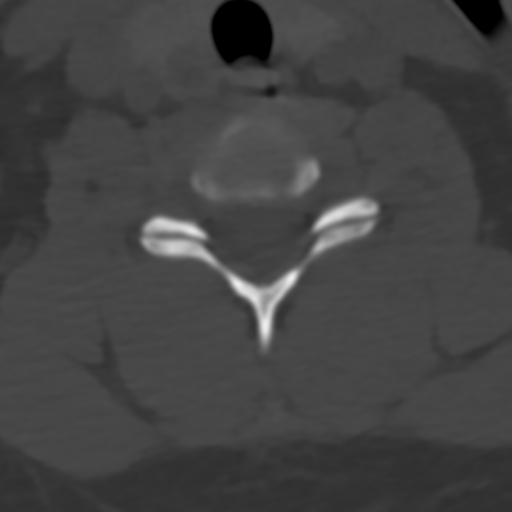
[im 59/89  bone]
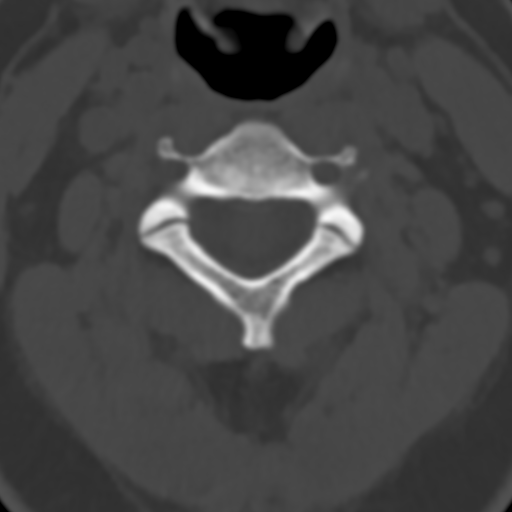

[12 of 33 positions shown; findings below may reference images not displayed]

FINDINGS: CT HEAD FINDINGS

No intracranial hemorrhage. No parenchymal contusion. No midline
shift or mass effect. Basilar cisterns are patent. No skull base
fracture. No fluid in the paranasal sinuses or mastoid air cells.
Orbits are normal.

CT CERVICAL SPINE FINDINGS

Alignment: Normal alignment of the cervical vertebral bodies.

Skull base and vertebrae: Normal craniocervical junction. No loss of
vertebral body height or disc height. Normal facet articulation. No
evidence of fracture.

Soft tissues and spinal canal: No prevertebral soft tissue swelling.
No perispinal or epidural hematoma.

Disc levels:  Unremarkable

Upper chest: 10 mm nodule RIGHT lobe of thyroid gland. No further
evaluation for nodules of the size per consensus criteria.

Other: None
IMPRESSION: 1. No intracranial trauma.  Normal head CT.
2. No cervical spine fracture.

## 2020-02-16 ENCOUNTER — Other Ambulatory Visit: Payer: Self-pay

## 2020-02-16 ENCOUNTER — Encounter (HOSPITAL_COMMUNITY): Payer: Self-pay

## 2020-02-16 ENCOUNTER — Ambulatory Visit (HOSPITAL_COMMUNITY)
Admission: EM | Admit: 2020-02-16 | Discharge: 2020-02-16 | Disposition: A | Discharge status: Home / Self Care | Payer: Managed Care, Other (non HMO) | Attending: Family Medicine | Admitting: Family Medicine

## 2020-02-16 DIAGNOSIS — N939 Abnormal uterine and vaginal bleeding, unspecified: Secondary | ICD-10-CM | POA: Diagnosis present

## 2020-02-16 DIAGNOSIS — R5383 Other fatigue: Secondary | ICD-10-CM | POA: Insufficient documentation

## 2020-02-16 DIAGNOSIS — R42 Dizziness and giddiness: Secondary | ICD-10-CM | POA: Diagnosis present

## 2020-02-16 LAB — COMPREHENSIVE METABOLIC PANEL
ALT: 20 U/L (ref 0–44)
AST: 19 U/L (ref 15–41)
Albumin: 3.7 g/dL (ref 3.5–5.0)
Alkaline Phosphatase: 56 U/L (ref 38–126)
Anion gap: 12 (ref 5–15)
BUN: 11 mg/dL (ref 6–20)
CO2: 25 mmol/L (ref 22–32)
Calcium: 9 mg/dL (ref 8.9–10.3)
Chloride: 107 mmol/L (ref 98–111)
Creatinine, Ser: 0.98 mg/dL (ref 0.44–1.00)
GFR calc Af Amer: >60 mL/min (ref >60)
GFR calc non Af Amer: >60 mL/min (ref >60)
Glucose, Bld: 107 mg/dL — ABNORMAL HIGH (ref 70–99)
Potassium: 4 mmol/L (ref 3.5–5.1)
Sodium: 144 mmol/L (ref 135–145)
Total Bilirubin: 0.5 mg/dL (ref 0.3–1.2)
Total Protein: 7.3 g/dL (ref 6.5–8.1)

## 2020-02-16 LAB — CBC
HCT: 36 % (ref 36.0–46.0)
Hemoglobin: 11.7 g/dL — ABNORMAL LOW (ref 12.0–15.0)
MCH: 28.1 pg (ref 26.0–34.0)
MCHC: 32.5 g/dL (ref 30.0–36.0)
MCV: 86.3 fL (ref 80.0–100.0)
Platelets: 368 10*3/uL (ref 150–400)
RBC: 4.17 MIL/uL (ref 3.87–5.11)
RDW: 14 % (ref 11.5–15.5)
WBC: 4.8 10*3/uL (ref 4.0–10.5)
nRBC: 0 % (ref 0.0–0.2)

## 2020-02-16 MED ORDER — MEGESTROL ACETATE 40 MG PO TABS
40.0000 mg | ORAL_TABLET | Freq: Two times a day (BID) | ORAL | 0 refills | Status: AC
Start: 2020-02-16 — End: 2020-02-23

## 2020-02-16 MED ORDER — POLYSACCHARIDE IRON COMPLEX 150 MG PO CAPS: 150.0000 mg | ORAL_CAPSULE | Freq: Every day | ORAL | 0 refills | Status: AC

## 2020-02-16 NOTE — ED Triage Notes (Signed)
Pt c/o vaginal bleeding x 1 month, going through 3 tampons and 3 pads every day. Denies pain. Had dizziness this AM but did not lose consciousness.

## 2020-02-16 NOTE — Discharge Instructions (Addendum)
I have sent in iron to your pharmacy  I have also sent in Megace twice daily for one week  We are checking blood work to be sure that you are not anemic  Follow up as scheduled with OBGYN  Follow up with the ER for increased vaginal bleeding, continued fatigue, loss of consciousness

## 2020-02-16 NOTE — ED Provider Notes (Signed)
[facility]   [id number] 02/16/20 Arrival Time: 1204   CC: VAGINAL DISCHARGE  SUBJECTIVE:  Janice Anderson is a 44 y.o. female who presents with complaints of increased vaginal bleeding, and states that she has been having a period for a month. She denies a precipitating event, recent sexual encounter or recent antibiotic use. Describes that she has been passing quarter sized clots in her pads and in the toilet. Reports that is experiencing fatigue and dizziness as well. She has not taken OTC medications for this. She denies fever, chills, nausea, vomiting, abdominal or pelvic pain, urinary symptoms, vaginal itching, vaginal odor, vaginal bleeding, dyspareunia, vaginal rashes or lesions.   Patient's last menstrual period was 02/16/2020. Current birth control method: none  Compliant with BC: NA  ROS: As per HPI.  All other pertinent ROS negative.     Past Medical History:  Diagnosis Date  . Abnormal Pap smear 06/07/2012  . Anemia   . Chronic kidney disease kidney stones and pyelo  . Heart murmur    SINCE BIRTH  . Infection    KIDNEY OCC  . Infection 11/2011   CHLAMYDIA  . No pertinent past medical history   . Preeclampsia 12/26/2012   24 hr urine=456 on 12/25/12  . Preterm labor 40   STILLBIRTH   Past Surgical History:  Procedure Laterality Date  . DILATION AND CURETTAGE OF UTERUS    . THERAPEUTIC ABORTION    . WISDOM TOOTH EXTRACTION     No Known Allergies No current facility-administered medications on file prior to encounter.   No current outpatient medications on file prior to encounter.    Social History   Socioeconomic History  . Marital status: Single    Spouse name: Not on file  . Number of children: 2  . Years of education: 75  . Highest education level: Not on file  Occupational History  . Occupation: Nutritional therapist: [employer]  Tobacco Use  . Smoking status: Never Smoker  . Smokeless tobacco: Never Used  Substance and  Sexual Activity  . Alcohol use: Never  . Drug use: Never  . Sexual activity: Not Currently    Partners: Male    Birth control/protection: Condom  Other Topics Concern  . Not on file  Social History Narrative   ** Merged History Encounter **       Social Determinants of Health   Financial Resource Strain:   . Difficulty of Paying Living Expenses:   Food Insecurity:   . Worried About Programme researcher, broadcasting/film/video in the Last Year:   . Barista in the Last Year:   Transportation Needs:   . Freight forwarder (Medical):   Marland Kitchen Lack of Transportation (Non-Medical):   Physical Activity:   . Days of Exercise per Week:   . Minutes of Exercise per Session:   Stress:   . Feeling of Stress :   Social Connections:   . Frequency of Communication with Friends and Family:   . Frequency of Social Gatherings with Friends and Family:   . Attends Religious Services:   . Active Member of Clubs or Organizations:   . Attends Banker Meetings:   Marland Kitchen Marital Status:   Intimate Partner Violence:   . Fear of Current or Ex-Partner:   . Emotionally Abused:   Marland Kitchen Physically Abused:   . Sexually Abused:    Family History  Problem Relation Age of Onset  . Alcohol abuse Father   .  Birth defects Other        HEART DEFECT  . Early death Other        DIED AT AGE 63 MONTHS  . Diabetes Other   . Diabetes Maternal Uncle   . Diabetes Maternal Grandmother   . Diabetes Maternal Uncle   . Other Neg Hx     OBJECTIVE:  Vitals:   02/16/20 1214  BP: (!) 143/76  Pulse: 71  Resp: 16  Temp: 98.6 F (37 C)  SpO2: 96%     General appearance: Alert, NAD, appears stated age Head: NCAT Throat: lips, mucosa, and tongue normal; teeth and gums normal Lungs: CTA bilaterally without adventitious breath sounds Heart: regular rate and rhythm.  Radial pulses 2+ symmetrical bilaterally Back: no CVA tenderness Abdomen: soft, non-tender; bowel sounds normal; no masses or organomegaly; no guarding or  rebound tenderness GU: declines  Skin: warm and dry Psychological:  Alert and cooperative. Normal mood and affect.  LABS:  Results for orders placed or performed during the hospital encounter of 02/16/20  CBC  Result Value Ref Range   WBC 4.8 4.0 - 10.5 K/uL   RBC 4.17 3.87 - 5.11 MIL/uL   Hemoglobin 11.7 (L) 12.0 - 15.0 g/dL   HCT 36.0 36.0 - 46.0 %   MCV 86.3 80.0 - 100.0 fL   MCH 28.1 26.0 - 34.0 pg   MCHC 32.5 30.0 - 36.0 g/dL   RDW 14.0 11.5 - 15.5 %   Platelets 368 150 - 400 K/uL   nRBC 0.0 0.0 - 0.2 %  Comprehensive metabolic panel  Result Value Ref Range   Sodium 144 135 - 145 mmol/L   Potassium 4.0 3.5 - 5.1 mmol/L   Chloride 107 98 - 111 mmol/L   CO2 25 22 - 32 mmol/L   Glucose, Bld 107 (H) 70 - 99 mg/dL   BUN 11 6 - 20 mg/dL   Creatinine, Ser 0.98 0.44 - 1.00 mg/dL   Calcium 9.0 8.9 - 10.3 mg/dL   Total Protein 7.3 6.5 - 8.1 g/dL   Albumin 3.7 3.5 - 5.0 g/dL   AST 19 15 - 41 U/L   ALT 20 0 - 44 U/L   Alkaline Phosphatase 56 38 - 126 U/L   Total Bilirubin 0.5 0.3 - 1.2 mg/dL   GFR calc non Af Amer >60 >60 mL/min   GFR calc Af Amer >60 >60 mL/min   Anion gap 12 5 - 15    Labs Reviewed  CBC - Abnormal; Notable for the following components:      Result Value   Hemoglobin 11.7 (*)    All other components within normal limits  COMPREHENSIVE METABOLIC PANEL - Abnormal; Notable for the following components:   Glucose, Bld 107 (*)    All other components within normal limits    ASSESSMENT & PLAN:  1. Abnormal vaginal bleeding   2. Other fatigue   3. Dizziness and giddiness     Meds ordered this encounter  Medications  . megestrol (MEGACE) 40 MG tablet    Sig: Take 1 tablet (40 mg total) by mouth 2 (two) times daily for 7 days.    Dispense:  14 tablet    Refill:  0    Order Specific Question:   Supervising Provider    Answer:   Chase Picket [id number]  . iron polysaccharides (NIFEREX) 150 MG capsule    Sig: Take 1 capsule (150 mg total) by mouth  daily.    Dispense:  30 capsule    Refill:  0    Order Specific Question:   Supervising Provider    Answer:   Merrilee Jansky [[id number]]    Pending: Labs Reviewed  CBC - Abnormal; Notable for the following components:      Result Value   Hemoglobin 11.7 (*)    All other components within normal limits  COMPREHENSIVE METABOLIC PANEL - Abnormal; Notable for the following components:   Glucose, Bld 107 (*)    All other components within normal limits    Abnormal Vaginal Bleeding Fatigue Dizziness Prescribed iron Prescribed megace Follow up with GYN Follow up with PCP or [facility] if symptoms persists Return here or go to ER if you have any new or worsening symptoms fever, chills, nausea, vomiting, abdominal or pelvic pain, painful intercourse, vaginal discharge, vaginal bleeding, persistent symptoms despite treatment Reviewed expectations re: course of current medical issues. Questions answered. Outlined signs and symptoms indicating need for more acute intervention. Patient verbalized understanding. After Visit Summary given.        Moshe Cipro, NP 02/16/20 1803
